# Patient Record
Sex: Male | Born: 1975 | Race: Asian | Hispanic: No | Marital: Married
Health system: Midwestern US, Community
[De-identification: ages and names within clinical notes are randomized; demographics above are authoritative.]

## PROBLEM LIST (undated history)

## (undated) DIAGNOSIS — I25110 Atherosclerotic heart disease of native coronary artery with unstable angina pectoris: Secondary | ICD-10-CM

## (undated) DIAGNOSIS — R7303 Prediabetes: Secondary | ICD-10-CM

## (undated) DIAGNOSIS — J309 Allergic rhinitis, unspecified: Secondary | ICD-10-CM

## (undated) DIAGNOSIS — E782 Mixed hyperlipidemia: Principal | ICD-10-CM

## (undated) DIAGNOSIS — E669 Obesity, unspecified: Secondary | ICD-10-CM

## (undated) DIAGNOSIS — I10 Essential (primary) hypertension: Secondary | ICD-10-CM

## (undated) DIAGNOSIS — L7212 Trichodermal cyst: Secondary | ICD-10-CM

## (undated) DIAGNOSIS — J45909 Unspecified asthma, uncomplicated: Secondary | ICD-10-CM

## (undated) HISTORY — DX: Prediabetes: R73.03

## (undated) HISTORY — DX: Allergic rhinitis, unspecified: J30.9

## (undated) HISTORY — DX: Unspecified asthma, uncomplicated: J45.909

## (undated) HISTORY — DX: Trichodermal cyst: L72.12

## (undated) HISTORY — DX: Mixed hyperlipidemia: E78.2

## (undated) HISTORY — DX: Obesity, unspecified: E66.9

---

## 2005-01-27 ENCOUNTER — Ambulatory Visit (HOSPITAL_BASED_OUTPATIENT_CLINIC_OR_DEPARTMENT_OTHER): Admission: RE | Admit: 2005-01-27 | Discharge: 2005-01-27 | Payer: Self-pay | Admitting: General Surgery

## 2014-04-27 ENCOUNTER — Emergency Department (HOSPITAL_BASED_OUTPATIENT_CLINIC_OR_DEPARTMENT_OTHER): Payer: 59

## 2014-04-27 ENCOUNTER — Encounter (HOSPITAL_BASED_OUTPATIENT_CLINIC_OR_DEPARTMENT_OTHER): Payer: Self-pay

## 2014-04-27 ENCOUNTER — Emergency Department (HOSPITAL_BASED_OUTPATIENT_CLINIC_OR_DEPARTMENT_OTHER)
Admission: EM | Admit: 2014-04-27 | Discharge: 2014-04-28 | Disposition: A | Discharge status: Home / Self Care | Payer: 59 | Attending: Emergency Medicine | Admitting: Emergency Medicine

## 2014-04-27 DIAGNOSIS — R6883 Chills (without fever): Secondary | ICD-10-CM | POA: Insufficient documentation

## 2014-04-27 DIAGNOSIS — R079 Chest pain, unspecified: Secondary | ICD-10-CM | POA: Diagnosis present

## 2014-04-27 DIAGNOSIS — Z79899 Other long term (current) drug therapy: Secondary | ICD-10-CM | POA: Insufficient documentation

## 2014-04-27 DIAGNOSIS — I10 Essential (primary) hypertension: Secondary | ICD-10-CM | POA: Diagnosis not present

## 2014-04-27 HISTORY — DX: Essential (primary) hypertension: I10

## 2014-04-27 LAB — BASIC METABOLIC PANEL WITH GFR
Anion gap: 5 (ref 5–15)
BUN: 14 mg/dL (ref 6–23)
CO2: 28 mmol/L (ref 19–32)
Calcium: 9.2 mg/dL (ref 8.4–10.5)
Chloride: 103 mmol/L (ref 96–112)
Creatinine, Ser: 0.94 mg/dL (ref 0.50–1.35)
GFR calc Af Amer: >90 mL/min (ref >90)
GFR calc non Af Amer: >90 mL/min (ref >90)
Glucose, Bld: 117 mg/dL — ABNORMAL HIGH (ref 70–99)
Potassium: 3.3 mmol/L — ABNORMAL LOW (ref 3.5–5.1)
Sodium: 136 mmol/L (ref 135–145)

## 2014-04-27 LAB — TROPONIN I: Troponin I: <0.03 ng/mL (ref <0.031)

## 2014-04-27 MED ORDER — ASPIRIN 81 MG PO CHEW
324.0000 mg | CHEWABLE_TABLET | Freq: Once | ORAL | Status: AC
  Administered 2014-04-27: 324 mg via ORAL
  Filled 2014-04-27: qty 4

## 2014-04-27 NOTE — ED Notes (Signed)
Pt alert, NAD, calm, interactive, resps e/u, speaking in clear complete sentences, VSS.  

## 2014-04-27 NOTE — ED Notes (Signed)
Pt reports L sided chest wall pain no radiation, denies additional symptoms.  Wife reports that he had one episode of chills, reports earlier today felt like he was going to fall although denies lightheaded.  States "i just felt like my body was going to fall".  Has been seen by pcp for same pain 3 years ago and without diagnosis.

## 2014-04-27 NOTE — ED Provider Notes (Signed)
CSN: [id number]     Arrival date & time 04/27/14  2048 History  This chart was scribed for Enid Skeens, MD by [place] Peace, ED Scribe. The patient was seen in [facility]. The patient's care was started at 10:08 PM.     Chief Complaint  Patient presents with  . Chest Pain      Patient is a 39 y.o. male presenting with chest pain. The history is provided by the patient. No language interpreter was used.  Chest Pain Associated symptoms: no abdominal pain, no back pain, no fever, no headache, no nausea, no shortness of breath and not vomiting     HPI Comments: Godson Pollan is a 39 y.o. male who presents to the Emergency Department complaining of non-radiating left sided chest pain onset 3 days ago, constant since this morning, non radiating, no exertional sxs or diaphoresis. Patient denies blood clot history, active cancer, recent major trauma or surgery, unilateral leg swelling/ pain, recent long travel, hemoptysis or oral contraceptives.  Pt states pain has progressed past the point where he not only feels discomfort, but feels pressure in his chest which is different from any of his past experiences. Pt explains he had similar occurrence 3 years ago where he was seen by his PCP but was not diagnosed. He also reports he has gained 20 lbs recently. Pt is non-smoker.History of hypertension. Pt has family history of heart problems.    Past Medical History  Diagnosis Date  . Hypertension    History reviewed. No pertinent past surgical history. No family history on file. History  Substance Use Topics  . Smoking status: Never Smoker   . Smokeless tobacco: Not on file  . Alcohol Use: No    Review of Systems  Constitutional: Positive for chills. Negative for fever.  HENT: Negative for congestion.   Eyes: Negative for visual disturbance.  Respiratory: Negative for shortness of breath.   Cardiovascular: Positive for chest pain.  Gastrointestinal: Negative for nausea, vomiting and  abdominal pain.  Genitourinary: Negative for dysuria and flank pain.  Musculoskeletal: Negative for back pain, neck pain and neck stiffness.  Skin: Negative for rash.  Neurological: Negative for light-headedness and headaches.      Allergies  Advil  Home Medications   Prior to Admission medications   Medication Sig Start Date End Date Taking? Authorizing Provider  hydrochlorothiazide (MICROZIDE) 12.5 MG capsule Take 12.5 mg by mouth daily.   Yes Historical Provider, MD   BP 143/102 mmHg  Pulse 96  Temp(Src) 98.6 F (37 C) (Oral)  Resp 18  Ht  (1.778 m)  Wt 241 lb (109.317 kg)  BMI 34.58 kg/m2  SpO2 99% Physical Exam  Constitutional: He is oriented to person, place, and time. He appears well-developed and well-nourished. No distress.  HENT:  Head: Normocephalic and atraumatic.  Eyes: Conjunctivae and EOM are normal.  Neck: Neck supple. No tracheal deviation present.  Cardiovascular: Normal rate and regular rhythm.   No leg swelling.   Pulmonary/Chest: Effort normal and breath sounds normal. No respiratory distress.  Abdominal: Soft. There is no tenderness.  Musculoskeletal: Normal range of motion.  Neurological: He is alert and oriented to person, place, and time.  Skin: Skin is warm and dry.  Psychiatric: He has a normal mood and affect. His behavior is normal.  Nursing note and vitals reviewed.   ED Course  Procedures (including critical care time) Labs Review Labs Reviewed  BASIC METABOLIC PANEL - Abnormal; Notable for the following:  Potassium 3.3 (*)    Glucose, Bld 117 (*)    All other components within normal limits  TROPONIN I  TROPONIN I    Imaging Review Dg Chest 2 View  04/27/2014   CLINICAL DATA:  Left-sided chest pain today.  EXAM: CHEST  2 VIEW  COMPARISON:  None.  FINDINGS: The heart size and mediastinal contours are within normal limits. Both lungs are clear. The visualized skeletal structures are unremarkable.  IMPRESSION: Normal chest  x-ray.   Electronically Signed   By: Loralie ChampagneMark  Gallerani M.D.   On: 04/27/2014 22:36     EKG Interpretation None     Medications  aspirin chewable tablet 324 mg (324 mg Oral Given 04/27/14 2223)   EKG reviewed heart rate 101, nonspecific T wave inversions inferiorly and flattening laterally, no acute ST elevation, poor baseline, mild prolonged QT. EKG reviewed heart rate 91, T-wave inversions inferiorly, no acute ST elevation, mild prolonged QT 10:15 PM- Treatment plan was discussed with patient who verbalizes understanding and agrees.   MDM   Final diagnoses:  Chest pain, unspecified chest pain type  Chest pain  I personally performed the services described in this documentation, which was scribed in my presence. The recorded information has been reviewed and is accurate.  Patient with high blood pressure history, low risk cardiac, very low risk blood clot presents with constant chest pain since this morning. EKG T-wave inversions inferiorly no acute ST elevation, similar to one on arrival had a poor baseline.  Plan for delta troponin, patient has had persistent left-sided chest ache since early this morning. Discussed outpatient follow-up for stress test and further evaluation. Patient comfortable this plan.  Patient's care be signed out to follow-up second troponin, if negative plan for discharge.  Results and differential diagnosis were discussed with the patient/parent/guardian. Close follow up outpatient was discussed, comfortable with the plan.   Medications  aspirin chewable tablet 324 mg (324 mg Oral Given 04/27/14 2223)    Filed Vitals:   04/27/14 2100 04/27/14 2230 04/27/14 2245 04/27/14 2300  BP: 143/102 123/85 114/76 109/64  Pulse: 96 92 92 88  Temp: 98.6 F (37 C)     TempSrc: Oral     Resp: 18     Height: 5\' 10"  (1.778 m)     Weight: 241 lb (109.317 kg)     SpO2: 99% 98% 98% 96%    Final diagnoses:  Chest pain, unspecified chest pain type       Enid SkeensJoshua M  Kohei Antonellis, MD 04/27/14 2350

## 2014-04-27 NOTE — Discharge Instructions (Signed)
Discuss further work up with your doctor, discuss possible stress test.  If you were given medicines take as directed.  If you are on coumadin or contraceptives realize their levels and effectiveness is altered by many different medicines.  If you have any reaction (rash, tongues swelling, other) to the medicines stop taking and see a physician.   Please follow up as directed and return to the ER or see a physician for new or worsening symptoms.  Thank you. Filed Vitals:   04/27/14 2100 04/27/14 2230 04/27/14 2245 04/27/14 2300  BP: 143/102 123/85 114/76 109/64  Pulse: 96 92 92 88  Temp: 98.6 F (37 C)     TempSrc: Oral     Resp: 18     Height: 5\' 10"  (1.778 m)     Weight: 241 lb (109.317 kg)     SpO2: 99% 98% 98% 96%

## 2014-04-27 NOTE — ED Notes (Signed)
Dr. Zavitz at BS.  

## 2014-04-28 LAB — TROPONIN I: Troponin I: <0.03 ng/mL (ref <0.031)

## 2014-04-28 NOTE — ED Notes (Signed)
Alert, NAD, calm, interactive, no changes, "feel a little better".

## 2014-11-12 ENCOUNTER — Telehealth (HOSPITAL_COMMUNITY): Payer: Self-pay

## 2014-11-12 ENCOUNTER — Other Ambulatory Visit (HOSPITAL_COMMUNITY): Payer: Self-pay | Admitting: Internal Medicine

## 2014-11-12 DIAGNOSIS — R079 Chest pain, unspecified: Secondary | ICD-10-CM

## 2014-11-12 NOTE — Telephone Encounter (Signed)
Patient given detailed instructions per Myocardial Perfusion Study Information Sheet for test on 11-13-2014 at 0730. Patient Notified to arrive 15 minutes early, and that it is imperative to arrive on time for appointment to keep from having the test rescheduled. Patient verbalized understanding. Randa Evens, Rashi Granier A

## 2014-11-13 ENCOUNTER — Ambulatory Visit (HOSPITAL_COMMUNITY): Payer: 59 | Attending: Cardiology

## 2014-11-13 DIAGNOSIS — R0609 Other forms of dyspnea: Secondary | ICD-10-CM | POA: Diagnosis not present

## 2014-11-13 DIAGNOSIS — R079 Chest pain, unspecified: Secondary | ICD-10-CM | POA: Diagnosis not present

## 2014-11-13 MED ORDER — TECHNETIUM TC 99M SESTAMIBI GENERIC - CARDIOLITE
10.3000 | Freq: Once | INTRAVENOUS | Status: AC | PRN
Start: 2014-11-13 — End: 2014-11-13
  Administered 2014-11-13: 10 via INTRAVENOUS

## 2014-11-13 MED ORDER — TECHNETIUM TC 99M SESTAMIBI GENERIC - CARDIOLITE
30.5000 | Freq: Once | INTRAVENOUS | Status: AC | PRN
  Administered 2014-11-13: 31 via INTRAVENOUS

## 2014-11-14 LAB — MYOCARDIAL PERFUSION IMAGING
CHL CUP NUCLEAR SSS: 0
CHL RATE OF PERCEIVED EXERTION: 18
CSEPEW: 10.1 METS
CSEPHR: 94 %
Exercise duration (min): 9 min
Exercise duration (sec): 0 s
LV dias vol: 73 mL
LV sys vol: 23 mL
MPHR: 183 {beats}/min
Peak HR: 173 {beats}/min
RATE: 0.3
Rest HR: 87 {beats}/min
SDS: 0
SRS: 0
TID: 0.64

## 2014-12-31 ENCOUNTER — Ambulatory Visit
Admission: RE | Admit: 2014-12-31 | Discharge: 2014-12-31 | Disposition: A | Discharge status: Home / Self Care | Payer: 59 | Source: Ambulatory Visit | Attending: Nurse Practitioner | Admitting: Nurse Practitioner

## 2014-12-31 ENCOUNTER — Other Ambulatory Visit: Payer: Self-pay | Admitting: Nurse Practitioner

## 2014-12-31 DIAGNOSIS — M25562 Pain in left knee: Secondary | ICD-10-CM

## 2015-07-14 ENCOUNTER — Other Ambulatory Visit: Payer: Self-pay | Admitting: Internal Medicine

## 2015-07-14 DIAGNOSIS — Z0000 Encounter for general adult medical examination without abnormal findings: Secondary | ICD-10-CM | POA: Diagnosis not present

## 2015-07-14 DIAGNOSIS — Z125 Encounter for screening for malignant neoplasm of prostate: Secondary | ICD-10-CM | POA: Diagnosis not present

## 2015-07-14 DIAGNOSIS — Z1389 Encounter for screening for other disorder: Secondary | ICD-10-CM | POA: Diagnosis not present

## 2015-07-14 DIAGNOSIS — M25562 Pain in left knee: Secondary | ICD-10-CM

## 2015-07-19 ENCOUNTER — Ambulatory Visit
Admission: RE | Admit: 2015-07-19 | Discharge: 2015-07-19 | Disposition: A | Discharge status: Home / Self Care | Payer: Self-pay | Source: Ambulatory Visit | Attending: Internal Medicine | Admitting: Internal Medicine

## 2015-07-19 DIAGNOSIS — M25562 Pain in left knee: Secondary | ICD-10-CM

## 2015-08-05 DIAGNOSIS — B079 Viral wart, unspecified: Secondary | ICD-10-CM | POA: Diagnosis not present

## 2016-02-26 DIAGNOSIS — G4710 Hypersomnia, unspecified: Secondary | ICD-10-CM | POA: Diagnosis not present

## 2016-02-26 DIAGNOSIS — Z0001 Encounter for general adult medical examination with abnormal findings: Secondary | ICD-10-CM | POA: Diagnosis not present

## 2016-02-26 DIAGNOSIS — R030 Elevated blood-pressure reading, without diagnosis of hypertension: Secondary | ICD-10-CM | POA: Diagnosis not present

## 2016-02-26 DIAGNOSIS — R0602 Shortness of breath: Secondary | ICD-10-CM | POA: Diagnosis not present

## 2016-02-26 DIAGNOSIS — R0683 Snoring: Secondary | ICD-10-CM | POA: Diagnosis not present

## 2016-03-02 DIAGNOSIS — R0602 Shortness of breath: Secondary | ICD-10-CM | POA: Diagnosis not present

## 2016-03-02 DIAGNOSIS — R0683 Snoring: Secondary | ICD-10-CM | POA: Diagnosis not present

## 2016-03-02 DIAGNOSIS — G4710 Hypersomnia, unspecified: Secondary | ICD-10-CM | POA: Diagnosis not present

## 2016-03-02 DIAGNOSIS — R030 Elevated blood-pressure reading, without diagnosis of hypertension: Secondary | ICD-10-CM | POA: Diagnosis not present

## 2016-07-14 DIAGNOSIS — Z0000 Encounter for general adult medical examination without abnormal findings: Secondary | ICD-10-CM | POA: Diagnosis not present

## 2016-07-14 DIAGNOSIS — R7303 Prediabetes: Secondary | ICD-10-CM | POA: Diagnosis not present

## 2016-07-14 DIAGNOSIS — Z125 Encounter for screening for malignant neoplasm of prostate: Secondary | ICD-10-CM | POA: Diagnosis not present

## 2016-07-14 DIAGNOSIS — Z1389 Encounter for screening for other disorder: Secondary | ICD-10-CM | POA: Diagnosis not present

## 2017-02-02 ENCOUNTER — Other Ambulatory Visit: Payer: Self-pay | Admitting: Internal Medicine

## 2017-02-02 DIAGNOSIS — R0789 Other chest pain: Secondary | ICD-10-CM

## 2017-02-08 ENCOUNTER — Ambulatory Visit
Admission: RE | Admit: 2017-02-08 | Discharge: 2017-02-08 | Disposition: A | Discharge status: Home / Self Care | Payer: No Typology Code available for payment source | Source: Ambulatory Visit | Attending: Internal Medicine | Admitting: Internal Medicine

## 2017-02-08 DIAGNOSIS — R0789 Other chest pain: Secondary | ICD-10-CM

## 2017-02-09 ENCOUNTER — Other Ambulatory Visit: Payer: Self-pay

## 2017-02-09 DIAGNOSIS — J45909 Unspecified asthma, uncomplicated: Secondary | ICD-10-CM | POA: Insufficient documentation

## 2017-02-09 DIAGNOSIS — L7212 Trichodermal cyst: Secondary | ICD-10-CM

## 2017-02-09 DIAGNOSIS — J309 Allergic rhinitis, unspecified: Secondary | ICD-10-CM

## 2017-02-09 DIAGNOSIS — R7303 Prediabetes: Secondary | ICD-10-CM

## 2017-02-09 DIAGNOSIS — I10 Essential (primary) hypertension: Secondary | ICD-10-CM | POA: Insufficient documentation

## 2017-02-09 DIAGNOSIS — E669 Obesity, unspecified: Secondary | ICD-10-CM

## 2017-02-09 DIAGNOSIS — E782 Mixed hyperlipidemia: Secondary | ICD-10-CM

## 2017-02-09 HISTORY — DX: Mixed hyperlipidemia: E78.2

## 2017-02-09 HISTORY — DX: Allergic rhinitis, unspecified: J30.9

## 2017-02-09 HISTORY — DX: Prediabetes: R73.03

## 2017-02-09 HISTORY — DX: Trichodermal cyst: L72.12

## 2017-02-09 HISTORY — DX: Obesity, unspecified: E66.9

## 2017-02-20 NOTE — Progress Notes (Signed)
Cardiology Office Note   Date:  02/22/2017   ID:  Gregory ElseMuhammad Hancock, DOB 1975-05-29, MRN [id number]  PCP:  Georgann HousekeeperHusain, Karrar, MD    No chief complaint on file. chest pain   Wt Readings from Last 3 Encounters:  02/22/17 254 lb 3.2 oz (115.3 kg)  11/13/14 240 lb (108.9 kg)  04/27/14 241 lb (109.3 kg)       History of Present Illness: Gregory ElseMuhammad Hage is a 41 y.o. male who is being seen today for the evaluation of chest pain at the request of Georgann HousekeeperHusain, Karrar, MD.  He has had recurrent chest pain and had coronary CT showing coronary calcium in the LA distribution, in 11/18.  It is more of a "crawling feeling" on his chest.  It is not a pain.  He had a friend die suddently and this has been a source of anxiety.  Trigger for this is eating heavy meals.  He was given acid reducer but did not take.    He has recently started exercising by walking.  No chest crawling.  Drinking water helps.  Family h/o CAD.  Maternal uncle died at 2058, Paternal uncle died at 6160 from heart attack.    His stress has reduced as time has passed since his friend suddenly passed away.  He is feeling this crawling sensation lasts now. He has started walking and often times, feels like walking more after he finishes his planned routine. He has never had to stop walking due to any symptoms. He is making a commitment to improving his lifestyle. He is trying to eat healthier. He has also trying to get his family to be healthier as well. He mentions that his kids are somewhat overweight and he is trying to help reverse this trend. He is also committed to reducing stress in his professional life as well. 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    Past Medical History:  Diagnosis Date  . Allergic rhinitis 02/09/2017  . Elevated triglycerides with high cholesterol 02/09/2017  . Hypertension   . Obesity (BMI 30-39.9) 02/09/2017  . Prediabetes 02/09/2017  . RAD (reactive  airway disease)   . 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 No pertinent surgical history.   Current Outpatient Medications  Medication Sig Dispense Refill  . chlorthalidone (HYGROTON) 25 MG tablet Take 12.5 mg by mouth daily with breakfast.    . montelukast (SINGULAIR) 10 MG tablet Take 10 mg by mouth daily.    . ranitidine (ZANTAC) 150 MG tablet Take 150 mg by mouth 2 (two) times daily. 1 TABLET  AT BEDTIME    . atorvastatin (LIPITOR) 10 MG tablet Take 1 tablet (10 mg total) by mouth daily. 90 tablet 3   No current facility-administered medications for this visit.     Allergies:   Advil [ibuprofen]    Social History:  The patient  reports that  has never smoked. he has never used smokeless tobacco. He reports that he does not drink alcohol or use drugs.   Family History:  The patient's family history includes Heart attack in his maternal uncle and paternal aunt.    ROS:  Please see the history of present illness.   Otherwise, review of systems are positive for chest sensation as described above.   All other systems are reviewed and negative.    PHYSICAL EXAM: VS:  BP 140/80 (BP Location: Right Arm, Patient Position: Sitting, Cuff Size: Large)  Pulse 98   Ht 5\' 10"  (1.778 m)   Wt 254 lb 3.2 oz (115.3 kg)   SpO2 97%   BMI 36.47 kg/m  , BMI Body mass index is 36.47 kg/m. GEN: Well nourished, well developed, in no acute distress  HEENT: normal  Neck: no JVD, carotid bruits, or masses Cardiac: RRR; no murmurs, rubs, or gallops,no edema  Respiratory:  clear to auscultation bilaterally, normal work of breathing GI: soft, nontender, nondistended, + BS MS: no deformity or atrophy  Skin: warm and dry, no rash Neuro:  Strength and sensation are intact Psych: euthymic mood, full affect   EKG:   The ekg ordered today demonstrates NSR, early repol type- ST changes in the anterior leads   Recent Labs: No results found for requested labs within last 8760 hours.    Lipid Panel No results found for: CHOL, TRIG, HDL, CHOLHDL, VLDL, LDLCALC, LDLDIRECT   Other studies Reviewed: Additional studies/ records that were reviewed today with results demonstrating: CT scan reviewed showing coronary calcium.   ASSESSMENT AND PLAN:  1. Chest discomfort: Very atypical.  Its nonexertional and getting better.  Would not pursue cath.   2. Coronary calcium: Given his lack of typical symptoms, and lack of difficulty with exercise, would not pursue any invasive angiography. Instead, I encouraged him to continue with his efforts on risk factor modification. Will add atorvastatin 10 mg daily. Check cholesterol and liver tests in 2-3 months. Continue regular exercise. If his symptoms change, let us know. If he has any limitations on his exercise, he should let us know. He is in agreement with this plan.  Keep LDL < 100.  3. Hypertension: Borderline blood pressure now. With weight loss and increased exercise, these readings should improve. 4. Obesity: Working on weight loss.    Current medicines are reviewed at length with the patient today.  The patient concerns regarding his medicines were addressed.  The following changes have been made:  No change  Labs/ tests ordered today include:   Orders Placed This Encounter  Procedures  . Lipid panel  . Hepatic function panel  . EKG 12-Lead    Recommend 150 minutes/week of aerobic exercise Low fat, low carb, high fiber diet recommended  Disposition:   FU in 1 year   Signed, Lance MussJayadeep Kinsler Soeder, MD  02/22/2017 3:33 PM    [facility] [address], [place], [place]  [address] Phone: [phone]; Fax: [phone]

## 2017-02-22 ENCOUNTER — Encounter: Payer: Self-pay | Admitting: Interventional Cardiology

## 2017-02-22 ENCOUNTER — Ambulatory Visit (INDEPENDENT_AMBULATORY_CARE_PROVIDER_SITE_OTHER): Payer: BLUE CROSS/BLUE SHIELD | Admitting: Interventional Cardiology

## 2017-02-22 VITALS — BP 140/80 | HR 98 | Ht 70.0 in | Wt 254.2 lb

## 2017-02-22 DIAGNOSIS — I2510 Atherosclerotic heart disease of native coronary artery without angina pectoris: Secondary | ICD-10-CM | POA: Diagnosis not present

## 2017-02-22 DIAGNOSIS — E669 Obesity, unspecified: Secondary | ICD-10-CM | POA: Diagnosis not present

## 2017-02-22 DIAGNOSIS — E785 Hyperlipidemia, unspecified: Secondary | ICD-10-CM | POA: Diagnosis not present

## 2017-02-22 DIAGNOSIS — R079 Chest pain, unspecified: Secondary | ICD-10-CM

## 2017-02-22 MED ORDER — ATORVASTATIN CALCIUM 10 MG PO TABS: 10.0000 mg | ORAL_TABLET | Freq: Every day | ORAL | 3 refills | Status: DC

## 2017-02-22 NOTE — Patient Instructions (Addendum)
Medication Instructions:  Your physician has recommended you make the following change in your medication:   START: atorvastatin (lipitor) 10 mg (1 tablet) daily  Labwork: Your physician recommends that you return for FASTING lab work in 2-3 months: cholesterol and liver function   Testing/Procedures: None ordered  Follow-Up: Your physician wants you to follow-up in: 1 year with Dr. Eldridge DaceVaranasi. You will receive a reminder letter in the mail two months in advance. If you don't receive a letter, please call our office to schedule the follow-up appointment.   Any Other Special Instructions Will Be Listed Below (If Applicable).     If you need a refill on your cardiac medications before your next appointment, please call your pharmacy.
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
## 2017-05-17 ENCOUNTER — Other Ambulatory Visit: Payer: BLUE CROSS/BLUE SHIELD

## 2017-08-08 DIAGNOSIS — J309 Allergic rhinitis, unspecified: Secondary | ICD-10-CM | POA: Diagnosis not present

## 2017-08-08 DIAGNOSIS — J9801 Acute bronchospasm: Secondary | ICD-10-CM | POA: Diagnosis not present

## 2017-09-14 DIAGNOSIS — Z1389 Encounter for screening for other disorder: Secondary | ICD-10-CM | POA: Diagnosis not present

## 2017-09-14 DIAGNOSIS — Z0000 Encounter for general adult medical examination without abnormal findings: Secondary | ICD-10-CM | POA: Diagnosis not present

## 2017-09-14 DIAGNOSIS — R7303 Prediabetes: Secondary | ICD-10-CM | POA: Diagnosis not present

## 2017-10-26 DIAGNOSIS — Z713 Dietary counseling and surveillance: Secondary | ICD-10-CM | POA: Diagnosis not present

## 2017-12-12 DIAGNOSIS — E1169 Type 2 diabetes mellitus with other specified complication: Secondary | ICD-10-CM | POA: Diagnosis not present

## 2017-12-12 DIAGNOSIS — E119 Type 2 diabetes mellitus without complications: Secondary | ICD-10-CM | POA: Diagnosis not present

## 2017-12-12 DIAGNOSIS — I10 Essential (primary) hypertension: Secondary | ICD-10-CM | POA: Diagnosis not present

## 2017-12-12 DIAGNOSIS — Z23 Encounter for immunization: Secondary | ICD-10-CM | POA: Diagnosis not present

## 2018-02-14 ENCOUNTER — Other Ambulatory Visit: Payer: Self-pay | Admitting: Interventional Cardiology

## 2018-04-05 DIAGNOSIS — I10 Essential (primary) hypertension: Secondary | ICD-10-CM | POA: Diagnosis not present

## 2018-04-05 DIAGNOSIS — E782 Mixed hyperlipidemia: Secondary | ICD-10-CM | POA: Diagnosis not present

## 2018-04-05 DIAGNOSIS — J309 Allergic rhinitis, unspecified: Secondary | ICD-10-CM | POA: Diagnosis not present

## 2018-04-05 DIAGNOSIS — E1169 Type 2 diabetes mellitus with other specified complication: Secondary | ICD-10-CM | POA: Diagnosis not present

## 2018-04-08 ENCOUNTER — Other Ambulatory Visit: Payer: Self-pay | Admitting: Interventional Cardiology

## 2018-04-19 DIAGNOSIS — E119 Type 2 diabetes mellitus without complications: Secondary | ICD-10-CM | POA: Diagnosis not present

## 2018-04-19 DIAGNOSIS — H35712 Central serous chorioretinopathy, left eye: Secondary | ICD-10-CM | POA: Diagnosis not present

## 2018-04-19 DIAGNOSIS — H2513 Age-related nuclear cataract, bilateral: Secondary | ICD-10-CM | POA: Diagnosis not present

## 2018-04-20 DIAGNOSIS — H35723 Serous detachment of retinal pigment epithelium, bilateral: Secondary | ICD-10-CM | POA: Diagnosis not present

## 2018-04-20 DIAGNOSIS — H5319 Other subjective visual disturbances: Secondary | ICD-10-CM | POA: Diagnosis not present

## 2018-04-20 DIAGNOSIS — H35712 Central serous chorioretinopathy, left eye: Secondary | ICD-10-CM | POA: Diagnosis not present

## 2018-07-04 DIAGNOSIS — I10 Essential (primary) hypertension: Secondary | ICD-10-CM | POA: Diagnosis not present

## 2018-08-07 DIAGNOSIS — E782 Mixed hyperlipidemia: Secondary | ICD-10-CM | POA: Diagnosis not present

## 2018-08-07 DIAGNOSIS — I10 Essential (primary) hypertension: Secondary | ICD-10-CM | POA: Diagnosis not present

## 2018-08-07 DIAGNOSIS — E1169 Type 2 diabetes mellitus with other specified complication: Secondary | ICD-10-CM | POA: Diagnosis not present

## 2018-12-13 DIAGNOSIS — I10 Essential (primary) hypertension: Secondary | ICD-10-CM | POA: Diagnosis not present

## 2018-12-13 DIAGNOSIS — Z23 Encounter for immunization: Secondary | ICD-10-CM | POA: Diagnosis not present

## 2018-12-13 DIAGNOSIS — Z0000 Encounter for general adult medical examination without abnormal findings: Secondary | ICD-10-CM | POA: Diagnosis not present

## 2018-12-13 DIAGNOSIS — E1169 Type 2 diabetes mellitus with other specified complication: Secondary | ICD-10-CM | POA: Diagnosis not present

## 2018-12-13 DIAGNOSIS — E119 Type 2 diabetes mellitus without complications: Secondary | ICD-10-CM | POA: Diagnosis not present

## 2018-12-13 DIAGNOSIS — Z1389 Encounter for screening for other disorder: Secondary | ICD-10-CM | POA: Diagnosis not present

## 2018-12-13 DIAGNOSIS — Z125 Encounter for screening for malignant neoplasm of prostate: Secondary | ICD-10-CM | POA: Diagnosis not present

## 2019-01-02 DIAGNOSIS — I10 Essential (primary) hypertension: Secondary | ICD-10-CM | POA: Diagnosis not present

## 2019-01-02 DIAGNOSIS — R0602 Shortness of breath: Secondary | ICD-10-CM | POA: Diagnosis not present

## 2019-01-02 DIAGNOSIS — R0789 Other chest pain: Secondary | ICD-10-CM | POA: Diagnosis not present

## 2019-01-02 DIAGNOSIS — E785 Hyperlipidemia, unspecified: Secondary | ICD-10-CM | POA: Diagnosis not present

## 2019-01-24 DIAGNOSIS — I519 Heart disease, unspecified: Secondary | ICD-10-CM | POA: Diagnosis not present

## 2019-01-24 DIAGNOSIS — I517 Cardiomegaly: Secondary | ICD-10-CM | POA: Diagnosis not present

## 2019-01-24 DIAGNOSIS — I059 Rheumatic mitral valve disease, unspecified: Secondary | ICD-10-CM | POA: Diagnosis not present

## 2019-01-31 DIAGNOSIS — R0789 Other chest pain: Secondary | ICD-10-CM | POA: Diagnosis not present

## 2019-01-31 DIAGNOSIS — R0602 Shortness of breath: Secondary | ICD-10-CM | POA: Diagnosis not present

## 2019-04-08 DIAGNOSIS — Z9189 Other specified personal risk factors, not elsewhere classified: Secondary | ICD-10-CM | POA: Diagnosis not present

## 2019-04-08 DIAGNOSIS — U071 COVID-19: Secondary | ICD-10-CM | POA: Diagnosis not present

## 2019-04-08 DIAGNOSIS — Z20822 Contact with and (suspected) exposure to covid-19: Secondary | ICD-10-CM | POA: Diagnosis not present

## 2019-04-09 ENCOUNTER — Telehealth: Payer: Self-pay | Admitting: Physician Assistant

## 2019-04-09 NOTE — Telephone Encounter (Signed)
Called to discuss with Coral Else about Covid symptoms and the use of bamlanivimab, a monoclonal antibody infusion for those with mild to moderate Covid symptoms and at a high risk of hospitalization.    Pt is qualified for this infusion at the [facility] infusion center due to co-morbid conditions and/or a member of an at-risk group, however declines infusion at this time. He would like to think about it and call me back. He wanted to see if his PCR test comes back positive since he is having such mild symptoms today. Last date he would be eligible for infusion is 04/16/19.   Patient Active Problem List   Diagnosis Date Noted  . Elevated triglycerides with high cholesterol 02/09/2017  . 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 Prediabetes 02/09/2017  . Obesity (BMI 30-39.9) 02/09/2017  . Allergic rhinitis 02/09/2017  . Hypertension   . RAD (reactive airway disease)     Cline Crock PA-C  [facility]

## 2019-04-12 DIAGNOSIS — U071 COVID-19: Secondary | ICD-10-CM | POA: Diagnosis not present

## 2019-04-12 DIAGNOSIS — Z9189 Other specified personal risk factors, not elsewhere classified: Secondary | ICD-10-CM | POA: Diagnosis not present

## 2019-04-18 DIAGNOSIS — J101 Influenza due to other identified influenza virus with other respiratory manifestations: Secondary | ICD-10-CM | POA: Diagnosis not present

## 2019-04-18 DIAGNOSIS — Z20828 Contact with and (suspected) exposure to other viral communicable diseases: Secondary | ICD-10-CM | POA: Diagnosis not present

## 2019-04-18 DIAGNOSIS — U071 COVID-19: Secondary | ICD-10-CM | POA: Diagnosis not present

## 2019-04-18 DIAGNOSIS — B974 Respiratory syncytial virus as the cause of diseases classified elsewhere: Secondary | ICD-10-CM | POA: Diagnosis not present

## 2019-04-20 DIAGNOSIS — Z9189 Other specified personal risk factors, not elsewhere classified: Secondary | ICD-10-CM | POA: Diagnosis not present

## 2019-04-20 DIAGNOSIS — U071 COVID-19: Secondary | ICD-10-CM | POA: Diagnosis not present

## 2019-12-10 DIAGNOSIS — Z125 Encounter for screening for malignant neoplasm of prostate: Secondary | ICD-10-CM | POA: Diagnosis not present

## 2019-12-10 DIAGNOSIS — Z23 Encounter for immunization: Secondary | ICD-10-CM | POA: Diagnosis not present

## 2019-12-10 DIAGNOSIS — E782 Mixed hyperlipidemia: Secondary | ICD-10-CM | POA: Diagnosis not present

## 2019-12-10 DIAGNOSIS — I10 Essential (primary) hypertension: Secondary | ICD-10-CM | POA: Diagnosis not present

## 2019-12-10 DIAGNOSIS — Z0000 Encounter for general adult medical examination without abnormal findings: Secondary | ICD-10-CM | POA: Diagnosis not present

## 2019-12-10 DIAGNOSIS — E1169 Type 2 diabetes mellitus with other specified complication: Secondary | ICD-10-CM | POA: Diagnosis not present

## 2020-02-21 DIAGNOSIS — R0602 Shortness of breath: Secondary | ICD-10-CM | POA: Diagnosis not present

## 2020-02-21 DIAGNOSIS — R079 Chest pain, unspecified: Secondary | ICD-10-CM | POA: Diagnosis not present

## 2020-02-21 DIAGNOSIS — E785 Hyperlipidemia, unspecified: Secondary | ICD-10-CM | POA: Diagnosis not present

## 2020-02-21 DIAGNOSIS — I10 Essential (primary) hypertension: Secondary | ICD-10-CM | POA: Diagnosis not present

## 2020-02-26 ENCOUNTER — Telehealth: Payer: Self-pay

## 2020-02-26 NOTE — Telephone Encounter (Signed)
Called patient to verify his information and send over to [facility] for sleep study per [facility]sk. Gregory Hancock

## 2020-02-28 ENCOUNTER — Other Ambulatory Visit (INDEPENDENT_AMBULATORY_CARE_PROVIDER_SITE_OTHER): Payer: BLUE CROSS/BLUE SHIELD | Admitting: Internal Medicine

## 2020-02-28 DIAGNOSIS — I10 Essential (primary) hypertension: Secondary | ICD-10-CM

## 2020-02-28 DIAGNOSIS — J4520 Mild intermittent asthma, uncomplicated: Secondary | ICD-10-CM

## 2020-02-28 DIAGNOSIS — I2510 Atherosclerotic heart disease of native coronary artery without angina pectoris: Secondary | ICD-10-CM | POA: Diagnosis not present

## 2020-02-28 DIAGNOSIS — G4719 Other hypersomnia: Secondary | ICD-10-CM | POA: Diagnosis not present

## 2020-02-28 DIAGNOSIS — I2584 Coronary atherosclerosis due to calcified coronary lesion: Secondary | ICD-10-CM

## 2020-02-28 DIAGNOSIS — E669 Obesity, unspecified: Secondary | ICD-10-CM

## 2020-02-28 NOTE — Progress Notes (Signed)
[facility] [address] [place], [place] [address]  Pulmonary Sleep Medicine   Office Visit Note  Patient Name: Gregory Hancock DOB: [birth year] MRN [id number]  Date of Service: 02/28/2020  Complaints/HPI: Patient is being seen for possible obstructive sleep apnea.  Patient has a past medical history significant for diabetes mellitus hypertension obesity weighing in at 254 pounds who has noticed that he is having increased daytime somnolence.  Patient has heavy snoring noted his wife tells him that he snores all night long.  Is also been experiencing on and off chest pain.  He has seen cardiology also for the chest pain patient had described it as pressure and also he had associated shortness of breath noted.  Pain at apparently last between 5 and 10 minutes.  He notices perhaps some orthopnea associated but not all the time.  Patient does have a strong history of coronary artery disease in the family.  He has been seen by cardiology in the past and has had a negative nuclear stress test and also a normal ejection fraction.  His symptoms however have persisted and patient is scheduled for coronary angiogram.  In addition to the cardiac issues that are going on the patient has been having excessive daytime somnolence.  He feels very sleepy during the daytime.  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  He has had no episodes of falling asleep while driving and has no history of seizure disorder.  Patient also has a history of chronic obstructive asthma.  He does use inhalers for this purpose.  He has never had any episodes of status asthmaticus.  And he has never smoked.  Current medications include Lipitor Singulair ranitidine and inhaler as needed  ROS  General: (-) fever, (-) chills, (-) night sweats, (-) weakness Skin: (-) rashes, (-) itching,. Eyes: (-) visual changes, (-) redness, (-) itching. Nose and Sinuses: (-)  nasal stuffiness or itchiness, (-) postnasal drip, (-) nosebleeds, (-) sinus trouble. Mouth and Throat: (-) sore throat, (-) hoarseness. Neck: (-) swollen glands, (-) enlarged thyroid, (-) neck pain. Respiratory: - cough, (-) bloody sputum, + shortness of breath, + wheezing. Cardiovascular: - ankle swelling, (-) chest pain. Lymphatic: (-) lymph node enlargement. Neurologic: (-) numbness, (-) tingling. Psychiatric: (-) anxiety, (-) depression   Current Medication: Outpatient Encounter Medications as of 02/28/2020  Medication Sig  . atorvastatin (LIPITOR) 10 MG tablet TAKE 1 TABLET BY MOUTH ONCE DAILY  . chlorthalidone (HYGROTON) 25 MG tablet Take 12.5 mg by mouth daily with breakfast.  . montelukast (SINGULAIR) 10 MG tablet Take 10 mg by mouth daily.  . ranitidine (ZANTAC) 150 MG tablet Take 150 mg by mouth 2 (two) times daily. 1 TABLET  AT BEDTIME   No facility-administered encounter medications on file as of 02/28/2020.    Surgical History: No past surgical history on file.  Medical History: Past Medical History:  Diagnosis Date  . Allergic rhinitis 02/09/2017  . Elevated triglycerides with high cholesterol 02/09/2017  . Hypertension   . Obesity (BMI 30-39.9) 02/09/2017  . Prediabetes 02/09/2017  . RAD (reactive airway disease)   . Trichilemmal cyst 02/09/2017    Family History: Family History  Problem Relation Age of Onset  . Heart attack Maternal Uncle   . Heart attack Paternal Aunt     Social History: Social History   Socioeconomic History  . Marital status: Married    Spouse name: Not on file  .  Number of children: Not on file  . Years of education: Not on file  . Highest education level: Not on file  Occupational History  . Not on file  Tobacco Use  . Smoking status: Never Smoker  . Smokeless tobacco: Never Used  Substance and Sexual Activity  . Alcohol use: No  . Drug use: No  . Sexual activity: Not on file  Other Topics Concern  . Not on file   Social History Narrative  . Not on file   Social Determinants of Health   Financial Resource Strain: Not on file  Food Insecurity: Not on file  Transportation Needs: Not on file  Physical Activity: Not on file  Stress: Not on file  Social Connections: Not on file  Intimate Partner Violence: Not on file    Vital Signs: There were no vitals taken for this visit.  Examination: General Appearance: The patient is well-developed, well-nourished, and in no distress. Skin: Gross inspection of skin unremarkable. Head: normocephalic, no gross deformities. Eyes: no gross deformities noted. ENT: ears appear grossly normal no exudates. Neck: Supple. No thyromegaly. No LAD. Respiratory: Scattered rhonchi expansion is equal. Cardiovascular: Normal S1 and S2 without murmur or rub. Extremities: No cyanosis. pulses are equal. Neurologic: Alert and oriented. No involuntary movements.  LABS: No results found for this or any previous visit (from the past 2160 hour(s)).  Radiology: CT CARDIAC SCORING  Result Date: 02/08/2017 CLINICAL DATA:  44 year old with family history of coronary artery disease. One month history of left chest pain and discomfort. Current history of hypertension. EXAM: CT HEART FOR CALCIUM SCORING TECHNIQUE: CT heart was performed on a 64 channel system using prospective ECG gating. A non-contrast exam for calcium scoring was performed. Note that this exam targets the heart and the chest was not imaged in its entirety. COMPARISON:  None. FINDINGS: Technical quality: Good. Respiratory motion blurred images of the base of the heart and the lung bases, though the calcium score does not appear to have been affected by this motion. CORONARY CALCIUM Total Agatston Score: 22.9 (all of the calcium is present in the LAD and its branches) MESA database percentile: Not applicable. The MESA database is relevant only between ages 66 and 92. OTHER FINDINGS: Cardiovascular: No evidence of  atherosclerosis involving the visualized thoracic aorta. Thoracic aorta normal in caliber with maximum diameter of the ascending thoracic aorta measuring approximately 3.4 cm. No pericardial effusion. Mediastinum/Nodes: No pathologic lymphadenopathy within the visualized mediastinum. Visualized esophagus normal in appearance. Lungs/Pleura: Visualized lung parenchyma clear. Central bronchi patent without significant bronchial wall thickening. No pleural effusions. Upper Abdomen: Normal low-dose unenhanced appearance. Large amount of food within the stomach. Musculoskeletal: Regional skeleton unremarkable. Other:  Minimal bilateral gynecomastia. IMPRESSION: 1. Coronary calcium Agatston score of 22.9, with the coronary atherosclerosis present in the LAD and its branches. This is greater atherosclerosis than expected at this patient's age. 2. The MESA database is not applicable for this patient (relevant only between ages 62 and 22) and therefore a percentile is not given. 3. No significant extracardiac findings. Minimal bilateral gynecomastia is noted. Electronically Signed   By: Hulan Saas M.D.   On: 02/08/2017 16:29    No results found.  No results found.    Assessment and Plan: Patient Active Problem List   Diagnosis Date Noted  . Elevated triglycerides with high cholesterol 02/09/2017  . 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 Prediabetes 02/09/2017  . Obesity (BMI 30-39.9) 02/09/2017  . Allergic rhinitis 02/09/2017  . Hypertension   .  RAD (reactive airway disease)     1. Hypersomnia with probable obstructive sleep apnea.  Patient has excessive daytime somnolence along with his sleepiness he also has a history of hypertension has a additional risk factor patient had an elevated coronary score with coronary atherosclerosis in the LAD as well as its branches.  Patient is also undergoing a work-up for the coronary artery disease with cardiology. 2. Coronary artery disease patient has known coronary  atherosclerosis in the LAD as well as the branches.  He has been having increasing chest pain noted and he is also with a strong family history of cardiac disease.  Last echocardiogram was done in 2028 last stress test was done in 2020 he has an abnormal CT angiogram.  With the existing coronary disease it may be better to do an in lab study. 3. Morbid obesity patient weighs 254pounds needs to work on diet and exercise once his cardiac status is situated. 4. Hypertension we will continue to monitor this and will be worked up per his primary care doctor.  General Counseling: I have discussed the findings of the evaluation and examination with [facility].  I have also discussed any further diagnostic evaluation thatmay be needed or ordered today. Joel verbalizes understanding of the findings of todays visit. We also reviewed his medications today and discussed drug interactions and side effects including but not limited excessive drowsiness and altered mental states. We also discussed that there is always a risk not just to him but also people around him. he has been encouraged to call the office with any questions or concerns that should arise related to todays visit.  No orders of the defined types were placed in this encounter.    Time spent: 55  I have personally obtained a history, examined the patient, evaluated laboratory and imaging results, formulated the assessment and plan and placed orders.    Yevonne Pax, MD [facility] Pulmonary and Critical Care Sleep medicine

## 2020-03-04 ENCOUNTER — Encounter (INDEPENDENT_AMBULATORY_CARE_PROVIDER_SITE_OTHER): Payer: BLUE CROSS/BLUE SHIELD | Admitting: Internal Medicine

## 2020-03-04 DIAGNOSIS — G4733 Obstructive sleep apnea (adult) (pediatric): Secondary | ICD-10-CM

## 2020-03-05 NOTE — Procedures (Signed)
SLEEP MEDICAL CENTER  Polysomnogram Report Part I                                                               Phone: [phone] Fax: [phone]  Patient Name: Gregory Hancock, Gregory Hancock Acquisition number: [id number]  Date of Birth: [birth year] Acquisition Date: 03/04/2020  Referring Physician: Leeanne Deed, NP     History: The patient is a 44 year old male who was referred for evaluation of possible sleep apnea. Medical History: hypertension, asthma.  Medications: Lipitor, Hygroton, Singulair, Zantac.  Procedure: This routine overnight polysomnogram was performed on the [facility] 5 using the standard diagnostic protocol. This included 6 channels of EEG, 2 channels of EOG, chin EMG, bilateral anterior tibialis EMG, nasal/oral thermistor, PTAF (nasal pressure transducer), chest and abdominal wall movements, EKG, and pulse oximetry.  Description: The total recording time was 413.8 minutes. The total sleep time was 342.3 minutes. There were a total of 44.0 minutes of wakefulness after sleep onset for a?reduced?sleep efficiency of 82.7%. The latency to sleep onset was within normal limits?at 27.5 minutes. The R sleep onset latency was within normal limits at 105.0 minutes.?? Sleep parameters, as a percentage of the total sleep time, demonstrated 3.8% of sleep was in N1 sleep, 74.4% N2, 2.5% N3 and 19.3% R sleep. There were a total of Hancock arousals for an arousal index of 5.4 arousals per hour of sleep that was normal.???  Respiratory monitoring demonstrated intermittent moderate degree of snoring in all positions. There were 50 apneas and hypopneas for an Apnea Hypopnea Index of 8.8 apneas and hypopneas per hour of sleep. The REM related apnea hypopnea index was 40.0/hr of REM sleep compared to a NREM AHI of 1.1/hr. The Respiratory Disturbance Index, which includes 1 respiratory effort related arousals (RERAs), was 8.9 respiratory events per hour of sleep.  The average duration of the respiratory  events was 30.4 seconds with a maximum duration of 74.0 seconds. The respiratory events occurred in all positions. The respiratory events were associated with peripheral oxygen desaturations on the average to 91%. The lowest oxygen desaturation associated with a respiratory event was 77%. Additionally, the baseline oxygen saturation during wakefulness was 98%, during NREM sleep averaged 97%, and during REM sleep averaged  97%. The total duration of oxygen < 90% was 1.3 minutes.  Cardiac monitoring- did not demonstrate transient cardiac decelerations associated with the apneas. There were no significant cardiac rhythm irregularities.   Periodic limb movement monitoring- demonstrated that there were 106 periodic limb movements for a periodic limb movement index of 18.6 periodic limb movements per hour of sleep. Quasi-periodic limb movements were observed during periods of wakefulness.    Impression: ???This routine overnight polysomnogram demonstrated significant REM-related obstructive sleep apnea with an overall Apnea Hypopnea Index of 8.8 apneas and hypopneas per hour of sleep, while the respiratory disturbance index, which increased to 40.0 in REM sleep.  There was a significantly elevated periodic limb movement index of 18.6 periodic limb movements per hour of sleep. In addition, quasi-periodic limb movements were observed during periods of wakefulness. Sometimes these limb movements subside once the apnea is controlled. Clinical correlation is suggested.  There was a reduced sleep efficiency with???increased awakenings?and a reduced percentage of slow wave sleep.  These findings would appear to be due to the combination of obstructive sleep apnea and periodic limb movements.   ?Recommendations:    1. This sleep study demonstrated presence of MILD sleep apnea with an overall AHI of 8.8 per hour. The AHI during REM sleep was SEVERE with an AHI of 40.0 per hour 2. In addition there is noted severe  oxygen desaturations with a low saturation of 77%. 3. Average heart rate was also noted to be 47bpm 4. A CPAP titration would be recommended for the sleep apnea.  5. Would recommend weight loss in a patient with a BMI of 33.5.  6. Alternative treatment options may include an oral appliance, a nasal resistance device or ENT surgery in the appropriate clinical context. ?    Yevonne Pax, MD, [facility] Diplomate ABMS Pulmonary, Critical Care and Sleep Medicine  Electronically reviewed and digitally signed          S[facility]EP [facility]ICAL CENTER Polysomnogram Report Part II  Phone: [phone] Fax: [phone]  Patient last name Gregory Hancock Neck Size   17 in. Acquisition [id number]  Patient first name Gregory Hancock Weight 240.0 lbs. Started 03/04/2020 at 10:58:15 PM  Birth date Gregory Hancock, Gregory Hancock Height 71.0 in. Stopped 03/05/2020 at 5:56:33 AM  Age 44 BMI 33.5 lb/in2 Duration 413.8  Study Type Adult      Report generated by: Hampton Abbot, RPSGT Sleep Data: Lights Out: 11:02:45 PM Sleep Onset: 11:30:15 PM  Lights On: 5:56:33 AM Sleep Efficiency: 82.7 %  Total Recording Time: 413.8 min Sleep Latency (from Lights Off) 27.5 min  Total Sleep Time (TST): 342.3 min R Latency (from Sleep Onset): 105.0 min  Sleep Period Time: 386.3 min Total number of awakenings: 22  Wake during sleep: 44.0 min Wake After Sleep Onset (WASO): 44.0 min   Sleep Data:         Arousal Summary: Stage  Latency from lights out (min) Latency from sleep onset (min) Duration (min) % Total Sleep Time  Normal values  N 1 27.5 0.0 13.0 3.8 (5%)  N 2 30.0 2.5 254.8 74.4 (50%)  N 3 83.0 55.5 8.5 2.5 (20%)  R 132.5 105.0 66.0 19.3 (25%)    Number Index  Spontaneous 37 6.5  Apneas & Hypopneas 5 0.9  RERAs 1 0.2       (Apneas & Hypopneas & RERAs)  (6) (1.1)  Limb Movement 4 0.7  Snore 0 0.0  TOTAL 47 8.2      Respiratory Data:  CA OA MA Apnea Hypopnea* A+ H RERA Total  Number 0 14 0 14 36 50 1 51  Mean Dur (sec) 0.0 19.8 0.0 19.8  34.8 30.6 22.0 30.4  Max Dur (sec) 0.0 41.0 0.0 41.0 74.0 74.0 22.0 74.0  Total Dur (min) 0.0 4.6 0.0 4.6 20.9 25.5 0.4 25.9  % of TST 0.0 1.3 0.0 1.3 6.1 7.4 0.1 7.6  Index (#/h TST) 0.0 2.5 0.0 2.5 6.3 8.8 0.2 8.9  *Hypopneas scored based on 4% or greater desaturation.  Sleep Stage:        REM NREM TST  AHI 40.0 1.1 8.8  RDI 40.9 1.1 8.9            Body Position Data:  Sleep (min) TST (%) REM (min) NREM (min) CA (#) OA (#) MA (#) HYP (#) AHI (#/h) RERA (#) RDI (#/h) Desat (#)  Supine 42.1 12.30 4.6 37.5 0 0 0 6 8.6 1 10.0 12  Non-Supine 300.20 87.70 61.40 238.80 0.00 14.00 0.00  30.00 8.79 0 8.79 61.00  Left: 188.1 54.95 37.3 150.8 0 4 0 21 8.0 0 8.0 40  Right: 112.1 32.75 24.1 88.0 0 10 0 9 10.2 0 10.2 21      Snoring: Total number of snoring episodes  0  Total time with snoring    min (   % of sleep)   Oximetry Distribution:             WK REM NREM TOTAL  Average (%)   98 97 97 97  < 90% 0.0 1.3 0.0 1.3  < 80% 0.0 0.1 0.0 0.1  < 70% 0.0 0.0 0.0 0.0  # of Desaturations* 3 53 17 73  Desat Index (#/hour) 2.5 48.2 3.7 12.8  Desat Max (%) 5 20 6 20   Desat Max Dur (sec) 118.0 96.0 103.0 118.0  Approx Min O2 during sleep 77  Approx min O2 during a respiratory event 77  Was Oxygen added (Y/N) and final rate No:   0 LPM  *Desaturations based on 3% or greater drop from baseline.   Cheyne Stokes Breathing: None Present     Heart Rate Summary:  Average Heart Rate During Sleep 46.6 bpm      Highest Heart Rate During Sleep (95th %) 88.0 bpm      Highest Heart Rate During Sleep 240 bpm (artifact)  Highest Heart Rate During Recording (TIB) 255 bpm (artifact)   Heart Rate Observations: Event Type # Events   Bradycardia 0 Lowest HR Scored: N/A  Sinus Tachycardia During Sleep 0 Highest HR Scored: N/A  Narrow Complex Tachycardia 0 Highest HR Scored: N/A  Wide Complex Tachycardia 0 Highest HR Scored: N/A  Asystole 0 Longest Pause: N/A  Atrial Fibrillation 0  Duration Longest Event: N/A  Other Arrythmias  No Type:    Periodic Limb Movement Data: (Primary legs unless otherwise noted) Total # Limb Movement 131 Limb Movement Index 23.0  Total # PLMS 106 PLMS Index 18.6  Total # PLMS Arousals 3 PLMS Arousal Index 0.5  Percentage Sleep Time with PLMS 57.22min (16.7 % sleep)  Mean Duration limb movements (secs) 285.8

## 2020-03-10 DIAGNOSIS — E1169 Type 2 diabetes mellitus with other specified complication: Secondary | ICD-10-CM | POA: Diagnosis not present

## 2020-03-27 DIAGNOSIS — H6122 Impacted cerumen, left ear: Secondary | ICD-10-CM | POA: Diagnosis not present

## 2020-03-27 DIAGNOSIS — H938X2 Other specified disorders of left ear: Secondary | ICD-10-CM | POA: Diagnosis not present

## 2020-04-09 DIAGNOSIS — H6122 Impacted cerumen, left ear: Secondary | ICD-10-CM | POA: Diagnosis not present

## 2020-05-12 ENCOUNTER — Telehealth: Payer: Self-pay

## 2020-05-12 NOTE — Telephone Encounter (Signed)
Left a message and asked pt to call back and schedule follow up from December with dr Freda Munro for sleep study results. Gregory Hancock

## 2020-09-08 DIAGNOSIS — I10 Essential (primary) hypertension: Secondary | ICD-10-CM | POA: Diagnosis not present

## 2020-09-08 DIAGNOSIS — E782 Mixed hyperlipidemia: Secondary | ICD-10-CM | POA: Diagnosis not present

## 2020-09-08 DIAGNOSIS — E1169 Type 2 diabetes mellitus with other specified complication: Secondary | ICD-10-CM | POA: Diagnosis not present

## 2020-09-08 DIAGNOSIS — R000 Tachycardia, unspecified: Secondary | ICD-10-CM | POA: Diagnosis not present

## 2020-09-09 DIAGNOSIS — Z23 Encounter for immunization: Secondary | ICD-10-CM | POA: Diagnosis not present

## 2020-09-09 DIAGNOSIS — E1169 Type 2 diabetes mellitus with other specified complication: Secondary | ICD-10-CM | POA: Diagnosis not present

## 2020-09-09 DIAGNOSIS — E782 Mixed hyperlipidemia: Secondary | ICD-10-CM | POA: Diagnosis not present

## 2020-09-28 DIAGNOSIS — Z20822 Contact with and (suspected) exposure to covid-19: Secondary | ICD-10-CM | POA: Diagnosis not present

## 2020-10-06 DIAGNOSIS — K219 Gastro-esophageal reflux disease without esophagitis: Secondary | ICD-10-CM | POA: Diagnosis not present

## 2020-11-05 DIAGNOSIS — R931 Abnormal findings on diagnostic imaging of heart and coronary circulation: Secondary | ICD-10-CM | POA: Diagnosis not present

## 2020-11-11 ENCOUNTER — Inpatient Hospital Stay: Payer: BLUE CROSS/BLUE SHIELD

## 2020-11-11 DIAGNOSIS — I2510 Atherosclerotic heart disease of native coronary artery without angina pectoris: Secondary | ICD-10-CM | POA: Insufficient documentation

## 2020-11-11 DIAGNOSIS — I25110 Atherosclerotic heart disease of native coronary artery with unstable angina pectoris: Secondary | ICD-10-CM | POA: Diagnosis not present

## 2020-11-11 DIAGNOSIS — E782 Mixed hyperlipidemia: Secondary | ICD-10-CM | POA: Diagnosis not present

## 2020-11-11 DIAGNOSIS — I10 Essential (primary) hypertension: Secondary | ICD-10-CM | POA: Diagnosis not present

## 2020-11-11 LAB — GLUCOSE, POC: Glucose (POC): 119 mg/dL — ABNORMAL HIGH (ref 65–117)

## 2020-11-11 LAB — POCT GLUCOSE: POC Glucose: 119 mg/dL — ABNORMAL HIGH (ref 65–117)

## 2020-11-11 MED ORDER — VERAPAMIL 2.5 MG/ML IV
2.5 mg/mL | INTRAVENOUS | Status: AC
Start: 2020-11-11 — End: ?

## 2020-11-11 MED ORDER — HEPARIN (PORCINE) IN NS (PF) 1,000 UNIT/500 ML IV
1000 unit/500 mL | INTRAVENOUS | Status: AC | PRN
Start: 2020-11-11 — End: 2020-11-11
  Administered 2020-11-11: 12:00:00

## 2020-11-11 MED ORDER — FENTANYL CITRATE (PF) 50 MCG/ML IJ SOLN
50 mcg/mL | INTRAMUSCULAR | Status: AC
Start: 2020-11-11 — End: ?

## 2020-11-11 MED ORDER — LIDOCAINE HCL 1 % (10 MG/ML) IJ SOLN
10 mg/mL (1 %) | INTRAMUSCULAR | Status: DC | PRN
Start: 2020-11-11 — End: 2020-11-11
  Administered 2020-11-11: 12:00:00 via INTRADERMAL

## 2020-11-11 MED ORDER — IOPAMIDOL 76 % IV SOLN
76 % | INTRAVENOUS | Status: DC | PRN
Start: 2020-11-11 — End: 2020-11-11
  Administered 2020-11-11 (×3)

## 2020-11-11 MED ORDER — VERAPAMIL 2.5 MG/ML IV
2.5 mg/mL | INTRAVENOUS | Status: DC | PRN
Start: 2020-11-11 — End: 2020-11-11
  Administered 2020-11-11: 12:00:00 via INTRA_ARTERIAL

## 2020-11-11 MED ORDER — NITROGLYCERIN 0.1 MG/ML (100 MCG/ML) D5W COMPOUNDED INJECTION
0.1 mg/mL | INTRAVENOUS | Status: DC | PRN
Start: 2020-11-11 — End: 2020-11-11
  Administered 2020-11-11: 12:00:00

## 2020-11-11 MED ORDER — NITROGLYCERIN 0.1 MG/ML (100 MCG/ML) D5W COMPOUNDED INJECTION
0.1 mg/mL | INTRAVENOUS | Status: AC
Start: 2020-11-11 — End: ?

## 2020-11-11 MED ORDER — FENTANYL CITRATE (PF) 50 MCG/ML IJ SOLN
50 mcg/mL | INTRAMUSCULAR | Status: DC | PRN
Start: 2020-11-11 — End: 2020-11-11
  Administered 2020-11-11: 12:00:00 via INTRAVENOUS

## 2020-11-11 MED ORDER — HEPARIN (PORCINE) IN NS (PF) 1,000 UNIT/500 ML IV
1000 unit/500 mL | INTRAVENOUS | Status: AC
Start: 2020-11-11 — End: ?

## 2020-11-11 MED ORDER — HEPARIN (PORCINE) 1,000 UNIT/ML IJ SOLN
1000 unit/mL | INTRAMUSCULAR | Status: DC | PRN
Start: 2020-11-11 — End: 2020-11-11
  Administered 2020-11-11: 12:00:00

## 2020-11-11 MED ORDER — MIDAZOLAM 1 MG/ML INJECTION (NDG ONLY)
1 mg/mL | INTRAMUSCULAR | Status: AC
Start: 2020-11-11 — End: ?

## 2020-11-11 MED ORDER — MIDAZOLAM 1 MG/ML IJ SOLN
1 mg/mL | INTRAMUSCULAR | Status: DC | PRN
Start: 2020-11-11 — End: 2020-11-11
  Administered 2020-11-11 (×2): via INTRAVENOUS

## 2020-11-11 MED ORDER — SODIUM CHLORIDE 0.9 % IV
INTRAVENOUS | Status: AC | PRN
Start: 2020-11-11 — End: 2020-11-11
  Administered 2020-11-11: 12:00:00 via INTRAVENOUS

## 2020-11-11 MED FILL — HEPARIN (PORCINE) IN NS (PF) 1,000 UNIT/500 ML IV: 1000 unit/500 mL | INTRAVENOUS | Qty: 1500

## 2020-11-11 MED FILL — MIDAZOLAM 1 MG/ML IJ SOLN: 1 mg/mL | INTRAMUSCULAR | Qty: 5

## 2020-11-11 MED FILL — FENTANYL CITRATE (PF) 50 MCG/ML IJ SOLN: 50 mcg/mL | INTRAMUSCULAR | Qty: 2

## 2020-11-11 MED FILL — NITROGLYCERIN 0.1 MG/ML (100 MCG/ML) D5W COMPOUNDED INJECTION: 0.1 mg/mL | INTRAVENOUS | Qty: 10

## 2020-11-11 MED FILL — VERAPAMIL 2.5 MG/ML IV: 2.5 mg/mL | INTRAVENOUS | Qty: 2

## 2020-11-11 NOTE — Progress Notes (Signed)
Cardiac Cath Lab Recovery Arrival Note:      Isaac Schaefer arrived to Cardiac Cath Lab, Recovery Area. Staff introduced to patient. Patient identifiers verified with NAME and DATE OF BIRTH. Procedure verified with patient. Consent forms reviewed and signed by patient or authorized representative and verified. Allergies verified.     Patient and family oriented to department. Patient and family informed of procedure and plan of care.     Questions answered with review. Patient prepped for procedure, per orders from physician, prior to arrival.    Patient on cardiac monitor, non-invasive blood pressure, SPO2 monitor. On RA. Patient is A&Ox 4. Patient reports no complaints.     Patient in stretcher, in low position, with side rails up, call bell within reach, patient instructed to call if assistance as needed.    Patient prep in: [facility] Recovery Area, [place] [facility].   Patient family has pager # none  Family in: brother in lobby.   Prep by: Shelton Silvas and Suszanne Conners

## 2020-11-11 NOTE — H&P (Signed)
H&P  by Marrian Salvage, MD at 11/11/20 [phone]                Author: Marrian Salvage, MD  Service: Cardiology  Author Type: Physician       Filed: 11/11/20 0728  Date of Service: 11/11/20 0723  Status: Signed          Editor: Marrian Salvage, MD (Physician)                       [facility]   [address]   [place], [place] [address]   [phone]   [email or web address]          CARDIOLOGY CONSULTATION          Date of  Admission: 11/11/2020  6:39 AM       Admission type:Elective        Attending Provider: Carla Drape*   Cardiology Provider:          Subjective:        Isaac Schaefer is a 45 y.o. male is here for cardiac cath. Developed progressive substernal chest burning and and discomfort few months ago. Had normal stress test earlier this year. PCP in [place] got calcium score and study revealed calcium score of > 400.  No previous cardia history. Symptoms gradually progressive. Cardiac cath recommended. He denies dyspnea, palpitations, irregular heart beats, near-syncope, syncope, fatigue, orthopnea, paroxysmal nocturnal dyspnea, claudication, lower extremity edema,  tachypnea. Previous treatment/evaluation includes  stress test and calcium score.  Cardiac risk factors: family history, diabetes mellitus, male gender, hypertension.           Patient Active Problem List           Diagnosis  Date Noted         ?  Coronary artery disease involving native coronary artery of native heart with unstable angina pectoris (HCC)  11/11/2020         ?  Coronary artery calcification seen on CAT scan  11/11/2020         None     Past Medical History:        Diagnosis  Date         ?  Asthma       ?  Diabetes (HCC)       ?  Hypertension           ?  Sleep apnea             Social History          Socioeconomic History         ?  Marital status:  MARRIED       Tobacco Use         ?  Smoking status:  Never     ?  Smokeless tobacco:  Never        Substance and Sexual Activity         ?  Alcohol use:  Never         ?  Drug use:  Never        No Known Allergies    History reviewed. No pertinent family history.      No current facility-administered medications for this encounter.            Review of Symptoms:    11 systems reviewed, negative other than as stated in the HPI  Objective:         Visit Vitals      BP  (!) 159/103 (BP 1 Location: Left arm, BP Patient Position: At rest)     Pulse  97     Temp  98.3 ??F (36.8 ??C)     Resp  18     Ht  5\' 11"  (1.803 m)     Wt  232 lb (105.2 kg)     SpO2  99%        BMI  32.36 kg/m??           Physical Exam       General: Well developed, in no acute distress, cooperative and alert   HEENT: No carotid bruits, no JVD, trach is midline. Neck Supple, PERRL, EOM intact.   Heart:  Normal S1/S2 negative S3 or S4. Regular, no murmur, gallop or rub.    Respiratory: Clear bilaterally, no wheezing or rales   Abdomen:   Soft, non-tender, bowel sounds are active.    Extremities:  No edema, no cyanosis, atraumatic.    Neuro: A&Ox3, speech clear.    Skin: Skin color is normal. No rashes or lesions. Non diaphoretic   Vascular: 2+ pulses         Assessment:          Active Problems:     Coronary artery disease involving native coronary artery of native heart with unstable angina pectoris (HCC) (11/11/2020)        Coronary artery calcification seen on CAT scan (11/11/2020)               Plan:        For cardiac cath today. Further recommendations afterwards. I discussed the risks/benefits/alternatives of the procedure with the patient. Risks include (but are not limited to) bleeding, infection, cva/mi/tamponade/death. The patient understands and  agrees to proceed.          11/13/2020, MD

## 2020-11-11 NOTE — Progress Notes (Signed)
Pt given discharge instructions. Questions answered.  Right wrist dressing dry and intact.  Up walking without complaints.  No voiced complaints of CP or SOB.  Discharged via wheelchair to car with family.

## 2020-11-11 NOTE — Progress Notes (Signed)
Primary Nurse Sarah Puryear and barbara, RN performed a dual skin assessment on this patient No impairment noted  Braden score is 23; meplix on sacrum

## 2020-11-20 ENCOUNTER — Encounter

## 2020-12-17 DIAGNOSIS — E1169 Type 2 diabetes mellitus with other specified complication: Secondary | ICD-10-CM | POA: Diagnosis not present

## 2020-12-17 DIAGNOSIS — I10 Essential (primary) hypertension: Secondary | ICD-10-CM | POA: Diagnosis not present

## 2020-12-17 DIAGNOSIS — E782 Mixed hyperlipidemia: Secondary | ICD-10-CM | POA: Diagnosis not present

## 2021-06-10 DIAGNOSIS — K219 Gastro-esophageal reflux disease without esophagitis: Secondary | ICD-10-CM | POA: Diagnosis not present

## 2021-06-10 DIAGNOSIS — E1169 Type 2 diabetes mellitus with other specified complication: Secondary | ICD-10-CM | POA: Diagnosis not present

## 2021-06-10 DIAGNOSIS — E782 Mixed hyperlipidemia: Secondary | ICD-10-CM | POA: Diagnosis not present

## 2021-08-05 DIAGNOSIS — H2513 Age-related nuclear cataract, bilateral: Secondary | ICD-10-CM | POA: Diagnosis not present

## 2021-08-05 DIAGNOSIS — H35713 Central serous chorioretinopathy, bilateral: Secondary | ICD-10-CM | POA: Diagnosis not present

## 2021-08-05 DIAGNOSIS — H524 Presbyopia: Secondary | ICD-10-CM | POA: Diagnosis not present

## 2021-08-05 DIAGNOSIS — E119 Type 2 diabetes mellitus without complications: Secondary | ICD-10-CM | POA: Diagnosis not present

## 2021-08-26 DIAGNOSIS — R0789 Other chest pain: Secondary | ICD-10-CM | POA: Diagnosis not present

## 2021-08-26 DIAGNOSIS — Z87892 Personal history of anaphylaxis: Secondary | ICD-10-CM | POA: Diagnosis not present

## 2022-01-13 DIAGNOSIS — E782 Mixed hyperlipidemia: Secondary | ICD-10-CM | POA: Diagnosis not present

## 2022-01-13 DIAGNOSIS — E1169 Type 2 diabetes mellitus with other specified complication: Secondary | ICD-10-CM | POA: Diagnosis not present

## 2022-01-14 DIAGNOSIS — L738 Other specified follicular disorders: Secondary | ICD-10-CM | POA: Diagnosis not present

## 2022-01-14 DIAGNOSIS — E669 Obesity, unspecified: Secondary | ICD-10-CM | POA: Diagnosis not present

## 2022-02-01 ENCOUNTER — Other Ambulatory Visit: Payer: Self-pay | Admitting: Internal Medicine

## 2022-02-01 MED ORDER — METOPROLOL SUCCINATE ER 25 MG PO TB24: 25.0000 mg | ORAL_TABLET | Freq: Every day | ORAL | 6 refills | Status: DC

## 2022-02-03 ENCOUNTER — Other Ambulatory Visit: Payer: Self-pay

## 2022-02-03 MED ORDER — METOPROLOL SUCCINATE ER 25 MG PO TB24: 25.0000 mg | ORAL_TABLET | Freq: Every day | ORAL | 6 refills | Status: DC

## 2022-02-04 DIAGNOSIS — E119 Type 2 diabetes mellitus without complications: Secondary | ICD-10-CM | POA: Diagnosis not present

## 2022-02-04 DIAGNOSIS — K219 Gastro-esophageal reflux disease without esophagitis: Secondary | ICD-10-CM | POA: Diagnosis not present

## 2022-02-04 DIAGNOSIS — Z1211 Encounter for screening for malignant neoplasm of colon: Secondary | ICD-10-CM | POA: Diagnosis not present

## 2022-02-04 DIAGNOSIS — R079 Chest pain, unspecified: Secondary | ICD-10-CM | POA: Diagnosis not present

## 2022-02-04 DIAGNOSIS — I10 Essential (primary) hypertension: Secondary | ICD-10-CM | POA: Diagnosis not present

## 2022-02-04 DIAGNOSIS — M940 Chondrocostal junction syndrome [Tietze]: Secondary | ICD-10-CM | POA: Diagnosis not present

## 2022-02-04 DIAGNOSIS — E782 Mixed hyperlipidemia: Secondary | ICD-10-CM | POA: Diagnosis not present

## 2022-02-25 ENCOUNTER — Other Ambulatory Visit: Payer: Self-pay

## 2022-02-25 MED ORDER — MONTELUKAST SODIUM 10 MG PO TABS: 10.0000 mg | ORAL_TABLET | Freq: Every day | ORAL | 0 refills | Status: DC

## 2022-02-25 MED ORDER — METFORMIN HCL ER 500 MG PO TB24: 500.0000 mg | ORAL_TABLET | Freq: Two times a day (BID) | ORAL | 0 refills | Status: DC

## 2022-03-01 ENCOUNTER — Other Ambulatory Visit: Payer: Self-pay

## 2022-03-03 ENCOUNTER — Other Ambulatory Visit: Payer: Self-pay | Admitting: Internal Medicine

## 2022-03-03 MED ORDER — OMEPRAZOLE 40 MG PO CPDR: 40.0000 mg | DELAYED_RELEASE_CAPSULE | Freq: Every day | ORAL | 6 refills | Status: DC

## 2022-03-16 ENCOUNTER — Other Ambulatory Visit: Payer: Self-pay

## 2022-03-16 ENCOUNTER — Encounter (HOSPITAL_BASED_OUTPATIENT_CLINIC_OR_DEPARTMENT_OTHER): Payer: Self-pay | Admitting: Emergency Medicine

## 2022-03-16 ENCOUNTER — Emergency Department (HOSPITAL_BASED_OUTPATIENT_CLINIC_OR_DEPARTMENT_OTHER)
Admission: EM | Admit: 2022-03-16 | Discharge: 2022-03-16 | Disposition: A | Discharge status: Home / Self Care | Payer: BC Managed Care – PPO | Attending: Emergency Medicine | Admitting: Emergency Medicine

## 2022-03-16 DIAGNOSIS — Z79899 Other long term (current) drug therapy: Secondary | ICD-10-CM | POA: Diagnosis not present

## 2022-03-16 DIAGNOSIS — I10 Essential (primary) hypertension: Secondary | ICD-10-CM | POA: Diagnosis not present

## 2022-03-16 DIAGNOSIS — R002 Palpitations: Secondary | ICD-10-CM | POA: Insufficient documentation

## 2022-03-16 DIAGNOSIS — R008 Other abnormalities of heart beat: Secondary | ICD-10-CM | POA: Diagnosis not present

## 2022-03-16 DIAGNOSIS — I2510 Atherosclerotic heart disease of native coronary artery without angina pectoris: Secondary | ICD-10-CM | POA: Diagnosis not present

## 2022-03-16 DIAGNOSIS — R0602 Shortness of breath: Secondary | ICD-10-CM | POA: Diagnosis not present

## 2022-03-16 DIAGNOSIS — E876 Hypokalemia: Secondary | ICD-10-CM | POA: Diagnosis not present

## 2022-03-16 LAB — CBC WITH DIFFERENTIAL/PLATELET
Abs Immature Granulocytes: 0.01 10*3/uL (ref 0.00–0.07)
Basophils Absolute: 0 10*3/uL (ref 0.0–0.1)
Basophils Relative: 0 %
Eosinophils Absolute: 0.1 10*3/uL (ref 0.0–0.5)
Eosinophils Relative: 2 %
HCT: 47.6 % (ref 39.0–52.0)
Hemoglobin: 15.9 g/dL (ref 13.0–17.0)
Immature Granulocytes: 0 %
Lymphocytes Relative: 34 %
Lymphs Abs: 2.3 10*3/uL (ref 0.7–4.0)
MCH: 28.1 pg (ref 26.0–34.0)
MCHC: 33.4 g/dL (ref 30.0–36.0)
MCV: 84.1 fL (ref 80.0–100.0)
Monocytes Absolute: 0.7 10*3/uL (ref 0.1–1.0)
Monocytes Relative: 10 %
Neutro Abs: 3.7 10*3/uL (ref 1.7–7.7)
Neutrophils Relative %: 54 %
Platelets: 260 10*3/uL (ref 150–400)
RBC: 5.66 MIL/uL (ref 4.22–5.81)
RDW: 12.7 % (ref 11.5–15.5)
WBC: 6.8 10*3/uL (ref 4.0–10.5)
nRBC: 0 % (ref 0.0–0.2)

## 2022-03-16 LAB — MAGNESIUM: Magnesium: 2.3 mg/dL (ref 1.7–2.4)

## 2022-03-16 LAB — BASIC METABOLIC PANEL
Anion gap: 9 (ref 5–15)
BUN: 17 mg/dL (ref 6–20)
CO2: 27 mmol/L (ref 22–32)
Calcium: 9.4 mg/dL (ref 8.9–10.3)
Chloride: 100 mmol/L (ref 98–111)
Creatinine, Ser: 0.96 mg/dL (ref 0.61–1.24)
GFR, Estimated: >60 mL/min (ref >60)
Glucose, Bld: 111 mg/dL — ABNORMAL HIGH (ref 70–99)
Potassium: 3.2 mmol/L — ABNORMAL LOW (ref 3.5–5.1)
Sodium: 136 mmol/L (ref 135–145)

## 2022-03-16 LAB — TROPONIN I (HIGH SENSITIVITY)
Troponin I (High Sensitivity): 3 ng/L (ref <18)
Troponin I (High Sensitivity): 3 ng/L (ref <18)

## 2022-03-16 MED ORDER — ASPIRIN 81 MG PO CHEW
324.0000 mg | CHEWABLE_TABLET | Freq: Once | ORAL | Status: AC
  Administered 2022-03-16: 324 mg via ORAL
  Filled 2022-03-16: qty 4

## 2022-03-16 MED ORDER — POTASSIUM CHLORIDE CRYS ER 20 MEQ PO TBCR
40.0000 meq | EXTENDED_RELEASE_TABLET | Freq: Once | ORAL | Status: AC
  Administered 2022-03-16: 40 meq via ORAL
  Filled 2022-03-16: qty 2

## 2022-03-16 MED ORDER — POTASSIUM CHLORIDE CRYS ER 20 MEQ PO TBCR: 20.0000 meq | EXTENDED_RELEASE_TABLET | Freq: Every day | ORAL | 0 refills | Status: AC

## 2022-03-16 NOTE — ED Notes (Signed)
Pt on monitor 

## 2022-03-16 NOTE — Discharge Instructions (Addendum)
Please discuss with your cardiologist whether you should adjust your dose of metoprolol, and whether you should have a heart monitor to see what is happening when your heart is going fast.

## 2022-03-16 NOTE — ED Triage Notes (Signed)
Pt states was asleep and woke up with high blood pressure. Noticed heart beat and some throat discomfort. Has been in contact with his cardiologist and was instructed if BP is high to take a second dose of metoprolol, witch he did yesterday and today.

## 2022-03-16 NOTE — ED Provider Notes (Signed)
[facility] EMERGENCY DEPARTMENT Provider Note   CSN: [id number] Arrival date & time: 03/16/22  0048     History  Chief complaint: Elevated blood pressure  Gregory Hancock is a 46 y.o. male.  The history is provided by the patient.  He has history of hypertension, prediabetes, hyperlipidemia, coronary artery disease and comes in because of waking up with his heart racing and blood pressure elevated.  Last night and tonight, he is awakened with heart rate 120-130.  When he checked his blood pressure, it was 155/95.  He denies chest pain, heaviness, tightness, pressure.  Denies dyspnea, nausea, diaphoresis.  He takes metoprolol 25 mg and his cardiologist had advised him to take an extra dose if his heart was racing.  Today, he had taken a total of 50 mg of metoprolol.   Home Medications Prior to Admission medications   Medication Sig Start Date End Date Taking? Authorizing Provider  atorvastatin (LIPITOR) 10 MG tablet TAKE 1 TABLET BY MOUTH ONCE DAILY 04/10/18   Corky Crafts, MD  chlorthalidone (HYGROTON) 25 MG tablet Take 12.5 mg by mouth daily with breakfast.    [provider]  metFORMIN (GLUCOPHAGE-XR) 500 MG 24 hr tablet Take 1 tablet (500 mg total) by mouth 2 (two) times daily. 02/25/22   Eloisa Northern, MD  metoprolol succinate (TOPROL-XL) 25 MG 24 hr tablet Take 1 tablet (25 mg total) by mouth daily. 02/03/22   Eloisa Northern, MD  montelukast (SINGULAIR) 10 MG tablet Take 1 tablet (10 mg total) by mouth daily. 02/25/22   Eloisa Northern, MD  omeprazole (PRILOSEC) 40 MG capsule Take 1 capsule (40 mg total) by mouth daily. 03/03/22   Eloisa Northern, MD  ranitidine (ZANTAC) 150 MG tablet Take 150 mg by mouth 2 (two) times daily. 1 TABLET  AT BEDTIME    [provider]      Allergies    Advil [ibuprofen]    Review of Systems   Review of Systems  All other systems reviewed and are negative.   Physical Exam Updated Vital Signs BP (!) 133/95 (BP Location: Right  Arm)   Pulse 94   Temp 98 F (36.7 C)   Resp 20   Ht 5\' 11"  (1.803 m)   Wt 105.2 kg   SpO2 96%   BMI 32.36 kg/m  Physical Exam Vitals and nursing note reviewed.   46 year old male, resting comfortably and in no acute distress. Vital signs are significant for mildly elevated blood pressure. Oxygen saturation is 96%, which is normal. Head is normocephalic and atraumatic. PERRLA, EOMI. Oropharynx is clear. Neck is nontender and supple without adenopathy or JVD. Back is nontender and there is no CVA tenderness. Lungs are clear without rales, wheezes, or rhonchi. Chest is nontender. Heart has regular rate and rhythm without murmur. Abdomen is soft, flat, nontender. Extremities have no cyanosis or edema, full range of motion is present. Skin is warm and dry without rash. Neurologic: Mental status is normal, cranial nerves are intact, moves all extremities equally.  ED Results / Procedures / Treatments   Labs (all labs ordered are listed, but only abnormal results are displayed) Labs Reviewed  BASIC METABOLIC PANEL - Abnormal; Notable for the following components:      Result Value   Potassium 3.2 (*)    Glucose, Bld 111 (*)    All other components within normal limits  CBC WITH DIFFERENTIAL/PLATELET  MAGNESIUM  TROPONIN I (HIGH SENSITIVITY)  TROPONIN I (HIGH SENSITIVITY)  EKG EKG Interpretation  Date/Time:  Tuesday March 16 2022 02:36:14 EST Ventricular Rate:  97 PR Interval:  139 QRS Duration: 91 QT Interval:  338 QTC Calculation: 430 R Axis:   56 Text Interpretation: Sinus rhythm Abnormal R-wave progression, early transition Nonspecific T abnormalities, inferior leads ST elevation, consider anterolateral injury When compared with ECG of 04/27/2014, No significant change was found Confirmed by Dione Booze ([id number]) on 03/16/2022 3:16:42 AM  Procedures Procedures  Cardiac monitor shows normal sinus rhythm with occasional PVC, per my interpretation.  Medications  Ordered in ED Medications - No data to display  ED Course/ Medical Decision Making/ A&P                           Medical Decision Making Amount and/or Complexity of Data Reviewed Labs: ordered.  Risk OTC drugs. Prescription drug management.   Palpitations with elevated blood pressure.  I am also concerned that this may be an anginal equivalent.  Palpitations could be from PSVT, sinus tachycardia, paroxysmal atrial fibrillation.  I have ordered ED workup of electrocardiogram, CBC, basic metabolic panel, troponin x 2.  I have ordered a dose of oral aspirin.  I have reviewed his past records and on 11/11/2020 he had a cardiac catheterization which showed a 90% lesion in the first diagonal off of the LAD but it was a small vessel and not amenable to PCI.  He therefore is known to have myocardium at risk.  I have reviewed and interpreted his laboratory test, and my interpretation is mild hypokalemia which is likely diuretic induced, mild elevation of random glucose, normal troponin x 2, normal magnesium level, normal CBC.  I have ordered a dose of oral potassium.  Blood pressure has come down with simple observation.  I am referring him back to his cardiologist to discuss possible adjustment of metoprolol dose, possible obtaining outpatient cardiac monitoring.  I am discharging him with a prescription for potassium.  Final Clinical Impression(s) / ED Diagnoses Final diagnoses:  Palpitations  Diuretic-induced hypokalemia    Rx / DC Orders ED Discharge Orders          Ordered    potassium chloride SA (KLOR-CON M) 20 MEQ tablet  Daily        03/16/22 0504              Dione Booze, MD 03/16/22 [phone]

## 2022-03-17 DIAGNOSIS — K2950 Unspecified chronic gastritis without bleeding: Secondary | ICD-10-CM | POA: Diagnosis not present

## 2022-03-17 DIAGNOSIS — R0789 Other chest pain: Secondary | ICD-10-CM | POA: Diagnosis not present

## 2022-03-17 DIAGNOSIS — K2970 Gastritis, unspecified, without bleeding: Secondary | ICD-10-CM | POA: Diagnosis not present

## 2022-03-17 DIAGNOSIS — K3189 Other diseases of stomach and duodenum: Secondary | ICD-10-CM | POA: Diagnosis not present

## 2022-03-17 DIAGNOSIS — R079 Chest pain, unspecified: Secondary | ICD-10-CM | POA: Diagnosis not present

## 2022-03-17 DIAGNOSIS — K219 Gastro-esophageal reflux disease without esophagitis: Secondary | ICD-10-CM | POA: Diagnosis not present

## 2022-03-17 DIAGNOSIS — K259 Gastric ulcer, unspecified as acute or chronic, without hemorrhage or perforation: Secondary | ICD-10-CM | POA: Diagnosis not present

## 2022-04-02 DIAGNOSIS — R002 Palpitations: Secondary | ICD-10-CM | POA: Diagnosis not present

## 2022-04-02 DIAGNOSIS — E876 Hypokalemia: Secondary | ICD-10-CM | POA: Diagnosis not present

## 2022-04-02 DIAGNOSIS — R0602 Shortness of breath: Secondary | ICD-10-CM | POA: Diagnosis not present

## 2022-04-05 DIAGNOSIS — R002 Palpitations: Secondary | ICD-10-CM | POA: Diagnosis not present

## 2022-04-06 DIAGNOSIS — R002 Palpitations: Secondary | ICD-10-CM | POA: Diagnosis not present

## 2022-04-13 DIAGNOSIS — E785 Hyperlipidemia, unspecified: Secondary | ICD-10-CM | POA: Diagnosis not present

## 2022-04-13 DIAGNOSIS — I10 Essential (primary) hypertension: Secondary | ICD-10-CM | POA: Diagnosis not present

## 2022-04-13 DIAGNOSIS — K219 Gastro-esophageal reflux disease without esophagitis: Secondary | ICD-10-CM | POA: Diagnosis not present

## 2022-04-13 DIAGNOSIS — E119 Type 2 diabetes mellitus without complications: Secondary | ICD-10-CM

## 2022-04-13 DIAGNOSIS — Z23 Encounter for immunization: Secondary | ICD-10-CM | POA: Diagnosis not present

## 2022-04-13 HISTORY — DX: Type 2 diabetes mellitus without complications: E11.9

## 2022-04-20 DIAGNOSIS — I10 Essential (primary) hypertension: Secondary | ICD-10-CM | POA: Diagnosis not present

## 2022-04-20 DIAGNOSIS — E119 Type 2 diabetes mellitus without complications: Secondary | ICD-10-CM | POA: Diagnosis not present

## 2022-04-20 DIAGNOSIS — E785 Hyperlipidemia, unspecified: Secondary | ICD-10-CM | POA: Diagnosis not present

## 2022-05-05 DIAGNOSIS — E119 Type 2 diabetes mellitus without complications: Secondary | ICD-10-CM | POA: Diagnosis not present

## 2022-05-05 DIAGNOSIS — E785 Hyperlipidemia, unspecified: Secondary | ICD-10-CM | POA: Diagnosis not present

## 2022-05-05 DIAGNOSIS — K219 Gastro-esophageal reflux disease without esophagitis: Secondary | ICD-10-CM | POA: Diagnosis not present

## 2022-05-05 DIAGNOSIS — I10 Essential (primary) hypertension: Secondary | ICD-10-CM | POA: Diagnosis not present

## 2022-05-05 DIAGNOSIS — E876 Hypokalemia: Secondary | ICD-10-CM | POA: Diagnosis not present

## 2022-05-15 ENCOUNTER — Encounter (HOSPITAL_BASED_OUTPATIENT_CLINIC_OR_DEPARTMENT_OTHER): Payer: Self-pay | Admitting: Emergency Medicine

## 2022-05-15 ENCOUNTER — Emergency Department (HOSPITAL_BASED_OUTPATIENT_CLINIC_OR_DEPARTMENT_OTHER)
Admission: EM | Admit: 2022-05-15 | Discharge: 2022-05-16 | Disposition: A | Discharge status: Home / Self Care | Payer: BC Managed Care – PPO | Attending: Emergency Medicine | Admitting: Emergency Medicine

## 2022-05-15 ENCOUNTER — Emergency Department (HOSPITAL_BASED_OUTPATIENT_CLINIC_OR_DEPARTMENT_OTHER): Payer: BC Managed Care – PPO

## 2022-05-15 ENCOUNTER — Other Ambulatory Visit: Payer: Self-pay

## 2022-05-15 DIAGNOSIS — Z79899 Other long term (current) drug therapy: Secondary | ICD-10-CM | POA: Diagnosis not present

## 2022-05-15 DIAGNOSIS — R072 Precordial pain: Secondary | ICD-10-CM | POA: Insufficient documentation

## 2022-05-15 DIAGNOSIS — I10 Essential (primary) hypertension: Secondary | ICD-10-CM | POA: Insufficient documentation

## 2022-05-15 DIAGNOSIS — R079 Chest pain, unspecified: Secondary | ICD-10-CM | POA: Diagnosis not present

## 2022-05-15 LAB — CBC
HCT: 47.4 % (ref 39.0–52.0)
Hemoglobin: 15.7 g/dL (ref 13.0–17.0)
MCH: 28 pg (ref 26.0–34.0)
MCHC: 33.1 g/dL (ref 30.0–36.0)
MCV: 84.6 fL (ref 80.0–100.0)
Platelets: 248 10*3/uL (ref 150–400)
RBC: 5.6 MIL/uL (ref 4.22–5.81)
RDW: 12.7 % (ref 11.5–15.5)
WBC: 7.4 10*3/uL (ref 4.0–10.5)
nRBC: 0 % (ref 0.0–0.2)

## 2022-05-15 LAB — BASIC METABOLIC PANEL
Anion gap: 8 (ref 5–15)
BUN: 15 mg/dL (ref 6–20)
CO2: 24 mmol/L (ref 22–32)
Calcium: 9.2 mg/dL (ref 8.9–10.3)
Chloride: 100 mmol/L (ref 98–111)
Creatinine, Ser: 0.89 mg/dL (ref 0.61–1.24)
GFR, Estimated: >60 mL/min (ref >60)
Glucose, Bld: 234 mg/dL — ABNORMAL HIGH (ref 70–99)
Potassium: 3.6 mmol/L (ref 3.5–5.1)
Sodium: 132 mmol/L — ABNORMAL LOW (ref 135–145)

## 2022-05-15 LAB — TROPONIN I (HIGH SENSITIVITY): Troponin I (High Sensitivity): 3 ng/L (ref <18)

## 2022-05-15 NOTE — ED Triage Notes (Signed)
Patient here with high blood pressure and chest pain. He states that it comes and goes.  No nausea or vomiting.  He states that he was here for same a couple of months ago.

## 2022-05-16 LAB — TROPONIN I (HIGH SENSITIVITY): Troponin I (High Sensitivity): 3 ng/L (ref <18)

## 2022-05-16 NOTE — ED Notes (Signed)
IV removed, skin clean/dry/intact. No bleeding.

## 2022-05-16 NOTE — ED Provider Notes (Signed)
[facility] Provider Note   CSN: [id number] Arrival date & time: 05/15/22  2125     History  Chief Complaint  Patient presents with   Hypertension   Chest Pain    Gregory Hancock is a 47 y.o. male.  The history is provided by the patient.  Chest Pain Pain location:  L chest Pain quality: sharp   Pain quality: not radiating   Pain radiates to:  Does not radiate Pain severity:  Moderate Onset quality:  Sudden Duration: lasts a second or 2 at a time but has had multiple episodes of seconds long pain. Timing:  Sporadic Progression:  Unchanged Chronicity:  New Context: at rest   Relieved by:  Nothing Worsened by:  Nothing Ineffective treatments:  None tried Associated symptoms: no cough, no diaphoresis, no heartburn, no lower extremity edema, no palpitations, no shortness of breath, no syncope, no vomiting and no weakness   Risk factors: hypertension and male sex   Patient with HTN presents with multiple episodes of seconds long pain in the left chest.  No associated symptoms.  No n/v/d.  No travel, no leg pain.  No trauma.  No exertional symptoms. BP was elevated on home cuff so he wanted to get checked.       Home Medications Prior to Admission medications   Medication Sig Start Date End Date Taking? Authorizing Provider  atorvastatin (LIPITOR) 10 MG tablet TAKE 1 TABLET BY MOUTH ONCE DAILY 04/10/18   Jettie Booze, MD  chlorthalidone (HYGROTON) 25 MG tablet Take 12.5 mg by mouth daily with breakfast.    [provider]  metFORMIN (GLUCOPHAGE-XR) 500 MG 24 hr tablet Take 1 tablet (500 mg total) by mouth 2 (two) times daily. 02/25/22   Garwin Brothers, MD  metoprolol succinate (TOPROL-XL) 25 MG 24 hr tablet Take 1 tablet (25 mg total) by mouth daily. 02/03/22   Garwin Brothers, MD  montelukast (SINGULAIR) 10 MG tablet Take 1 tablet (10 mg total) by mouth daily. 02/25/22   Garwin Brothers, MD  omeprazole (PRILOSEC) 40 MG capsule  Take 1 capsule (40 mg total) by mouth daily. 03/03/22   Garwin Brothers, MD  potassium chloride SA (KLOR-CON M) 20 MEQ tablet Take 1 tablet (20 mEq total) by mouth daily. [id number]   Delora Fuel, MD  ranitidine (ZANTAC) 150 MG tablet Take 150 mg by mouth 2 (two) times daily. 1 TABLET  AT BEDTIME    [provider]      Allergies    Advil [ibuprofen]    Review of Systems   Review of Systems  Constitutional:  Negative for diaphoresis.  Respiratory:  Negative for cough and shortness of breath.   Cardiovascular:  Positive for chest pain. Negative for palpitations and syncope.  Gastrointestinal:  Negative for heartburn and vomiting.  Neurological:  Negative for weakness.  All other systems reviewed and are negative.   Physical Exam Updated Vital Signs BP 126/80   Pulse 82   Temp 98.2 F (36.8 C) (Oral)   Resp 12   SpO2 97%  Physical Exam Vitals and nursing note reviewed.  Constitutional:      General: He is not in acute distress.    Appearance: He is well-developed. He is not diaphoretic.  HENT:     Head: Normocephalic and atraumatic.     Nose: Nose normal.  Eyes:     Conjunctiva/sclera: Conjunctivae normal.     Pupils: Pupils are equal, round, and reactive to light.  Cardiovascular:     Rate and Rhythm: Normal rate and regular rhythm.     Pulses: Normal pulses.     Heart sounds: Normal heart sounds.  Pulmonary:     Effort: Pulmonary effort is normal.     Breath sounds: Normal breath sounds. No wheezing or rales.  Abdominal:     General: Bowel sounds are normal.     Palpations: Abdomen is soft.     Tenderness: There is no abdominal tenderness. There is no guarding or rebound.  Musculoskeletal:        General: Normal range of motion.     Cervical back: Normal range of motion and neck supple.  Skin:    General: Skin is warm and dry.     Capillary Refill: Capillary refill takes less than 2 seconds.  Neurological:     General: No focal deficit present.     Mental  Status: He is alert and oriented to person, place, and time.     Deep Tendon Reflexes: Reflexes normal.  Psychiatric:        Mood and Affect: Mood normal.        Behavior: Behavior normal.     ED Results / Procedures / Treatments   Labs (all labs ordered are listed, but only abnormal results are displayed) Results for orders placed or performed during the hospital encounter of [id number]  Basic metabolic panel  Result Value Ref Range   Sodium 132 (L) 135 - 145 mmol/L   Potassium 3.6 3.5 - 5.1 mmol/L   Chloride 100 98 - 111 mmol/L   CO2 24 22 - 32 mmol/L   Glucose, Bld 234 (H) 70 - 99 mg/dL   BUN 15 6 - 20 mg/dL   Creatinine, Ser 0.89 0.61 - 1.24 mg/dL   Calcium 9.2 8.9 - 10.3 mg/dL   GFR, Estimated >60 >60 mL/min   Anion gap 8 5 - 15  CBC  Result Value Ref Range   WBC 7.4 4.0 - 10.5 K/uL   RBC 5.60 4.22 - 5.81 MIL/uL   Hemoglobin 15.7 13.0 - 17.0 g/dL   HCT 47.4 39.0 - 52.0 %   MCV 84.6 80.0 - 100.0 fL   MCH 28.0 26.0 - 34.0 pg   MCHC 33.1 30.0 - 36.0 g/dL   RDW 12.7 11.5 - 15.5 %   Platelets 248 150 - 400 K/uL   nRBC 0.0 0.0 - 0.2 %  Troponin I (High Sensitivity)  Result Value Ref Range   Troponin I (High Sensitivity) 3 <18 ng/L  Troponin I (High Sensitivity)  Result Value Ref Range   Troponin I (High Sensitivity) 3 <18 ng/L   DG Chest 2 View  Result Date: 05/15/2022 CLINICAL DATA:  Chest pain. EXAM: CHEST - 2 VIEW COMPARISON:  04/27/2014 FINDINGS: The cardiomediastinal contours are normal. The lungs are clear. Pulmonary vasculature is normal. No consolidation, pleural effusion, or pneumothorax. No acute osseous abnormalities are seen. IMPRESSION: Negative radiographs of the chest. Electronically Signed   By: Keith Rake M.D.   On: 05/15/2022 22:09    EKG Sinus tachycardia 106, normal intervals     Radiology DG Chest 2 View  Result Date: 05/15/2022 CLINICAL DATA:  Chest pain. EXAM: CHEST - 2 VIEW COMPARISON:  04/27/2014 FINDINGS: The cardiomediastinal  contours are normal. The lungs are clear. Pulmonary vasculature is normal. No consolidation, pleural effusion, or pneumothorax. No acute osseous abnormalities are seen. IMPRESSION: Negative radiographs of the chest. Electronically Signed   By: Aurther Loft.D.  On: 05/15/2022 22:09    Procedures Procedures    Medications Ordered in ED Medications - No data to display  ED Course/ Medical Decision Making/ A&P                             Medical Decision Making Patient with multiple episodes of pain lasting 1 to 2 seconds.    Amount and/or Complexity of Data Reviewed Independent Historian: friend    Details: See above  External Data Reviewed: notes.    Details: Previous notes reviewed  Labs: ordered.    Details: All labs reviewed: 2 negative troponins.  White count normal 7.4, normal hemoglobin 15.7, normal platelet count. Sodium 134, normal potassium 3.6, normal creatinine  Radiology: ordered and independent interpretation performed.    Details: Normal CXR ECG/medicine tests: ordered and independent interpretation performed. Decision-making details documented in ED Course.  Risk Risk Details: Patient with a second or 2 of pain.  BP normalized on own without intervention.  PERC 0, wells negative highly doubt PE.  Ruled out for MI in the ED, heart score is 2 low risk for MACE.  Pain lasting a second or 2 is not cardiac and is most consistent with percordial catch.  Stable for discharge.  Strict return.      Final Clinical Impression(s) / ED Diagnoses Final diagnoses:  Precordial pain   Return for intractable cough, coughing up blood, fevers > 100.4 unrelieved by medication, shortness of breath, intractable vomiting, chest pain, shortness of breath, weakness, numbness, changes in speech, facial asymmetry, abdominal pain, passing out, Inability to tolerate liquids or food, cough, altered mental status or any concerns. No signs of systemic illness or infection. The patient is  nontoxic-appearing on exam and vital signs are within normal limits.  I have reviewed the triage vital signs and the nursing notes. Pertinent labs & imaging results that were available during my care of the patient were reviewed by me and considered in my medical decision making (see chart for details). After history, exam, and medical workup I feel the patient has been appropriately medically screened and is safe for discharge home. Pertinent diagnoses were discussed with the patient. Patient was given return precautions Rx / DC Orders ED Discharge Orders     None         Robertha Staples, MD 05/16/22 [id number]

## 2022-05-23 ENCOUNTER — Other Ambulatory Visit: Payer: Self-pay | Admitting: Internal Medicine

## 2022-06-03 ENCOUNTER — Ambulatory Visit: Payer: BC Managed Care – PPO | Attending: Cardiology | Admitting: Cardiology

## 2022-06-03 ENCOUNTER — Encounter: Payer: Self-pay | Admitting: Cardiology

## 2022-06-03 VITALS — BP 130/88 | HR 80 | Ht 71.0 in | Wt 240.0 lb

## 2022-06-03 DIAGNOSIS — E669 Obesity, unspecified: Secondary | ICD-10-CM

## 2022-06-03 DIAGNOSIS — I10 Essential (primary) hypertension: Secondary | ICD-10-CM

## 2022-06-03 DIAGNOSIS — I2510 Atherosclerotic heart disease of native coronary artery without angina pectoris: Secondary | ICD-10-CM

## 2022-06-03 DIAGNOSIS — E119 Type 2 diabetes mellitus without complications: Secondary | ICD-10-CM | POA: Diagnosis not present

## 2022-06-03 DIAGNOSIS — E785 Hyperlipidemia, unspecified: Secondary | ICD-10-CM | POA: Diagnosis not present

## 2022-06-03 MED ORDER — METOPROLOL TARTRATE 100 MG PO TABS: ORAL_TABLET | ORAL | 0 refills | Status: DC

## 2022-06-03 NOTE — Progress Notes (Signed)
Cardiology Consultation:    Date:  06/03/2022   ID:  Gregory Hancock, DOB 1975-07-19, MRN [id number]  PCP:  Wenda Low, MD  Cardiologist:  Jenne Campus, MD   Referring MD: Wenda Low, MD   No chief complaint on file. I would like to have my heart checked  History of Present Illness:    Gregory Hancock is a 47 y.o. male who is being seen today for the evaluation of chest pain at the request of Wenda Low, MD. past medical history significant for type 2 diabetes, essential hypertension, hyperlipidemia in 2022 he had cardiac catheterization done which showed some 90% narrowing of small diagonal branch disease, no intervention has been done at that time.  He also got recently calcium score done which was 22.9.  He comes to me because of a constellation of symptoms he complained of having some chest pain located in the left side of his chest not related to exercise lasting for different duration.  No shortness of breath no sweating associated with this sensation.  He admits that he lives a very sedentary lifestyle.  He does not exercise on the regular basis, he is not on any special diet.  He does not smoke right now.  There is no family history of premature coronary artery disease.  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 No pertinent surgical history.  Current Medications: Current Meds  Medication Sig   atorvastatin (LIPITOR) 20 MG tablet Take 20 mg by mouth daily.   chlorthalidone (HYGROTON) 25 MG tablet Take 12.5 mg by mouth daily with breakfast.   metFORMIN (GLUCOPHAGE-XR) 500 MG 24 hr tablet TAKE ONE TABLET BY MOUTH  TWICE DAILY   metoprolol succinate (TOPROL-XL) 25 MG 24 hr tablet Take 1 tablet (25 mg total) by mouth daily.   metoprolol tartrate (LOPRESSOR) 100 MG tablet Take one tablet 2 hours before cardiac CT for heart greater than 55   montelukast (SINGULAIR) 10 MG tablet TAKE ONE TABLET BY MOUTH ONE TIME DAILY   omeprazole (PRILOSEC) 40 MG capsule Take 1 capsule (40 mg total) by mouth daily. (Patient taking differently: Take 40 mg by mouth daily as needed.)   potassium chloride SA (KLOR-CON M) 20 MEQ tablet Take 1 tablet (20 mEq total) by mouth daily.   valsartan (DIOVAN) 160 MG tablet Take 160 mg by mouth daily.     Allergies:   Advil [ibuprofen] and Semaglutide   Social History   Socioeconomic History   Marital status: Married    Spouse name: Not on file   Number of children: Not on file   Years of education: Not on file   Highest education level: Not on file  Occupational History   Not on file  Tobacco Use   Smoking status: Never   Smokeless tobacco: Never  Substance and Sexual Activity   Alcohol use: No   Drug use: No   Sexual activity: Not on file  Other Topics Concern   Not on file  Social History Narrative   Not on file   Social Determinants of Health   Financial Resource Strain: Not on file  Food Insecurity: Not on file  Transportation  Needs: Not on file  Physical Activity: Not on file  Stress: Not on file  Social Connections: Not on file     Family History: The patient's family history includes Heart attack in his maternal uncle and paternal aunt. ROS:   Please see the history of present illness.    All 14 point review of systems negative except as described per history of present illness.  EKGs/Labs/Other Studies Reviewed:    The following studies were reviewed today: Cardiac catheterization from 2022 showed:  Coronary Findings Diagnostic Dominance: Right Left Main: The vessel was visualized by angiography. The vessel is angiographically normal. Left  Anterior Descending: The vessel is angiographically normal. First Diagonal Branch: The vessel is small. 1st Diag lesion, 90% stenosed. The lesion is type A. Second Diagonal Branch: The vessel is angiographically normal. Left Circumflex: The vessel is angiographically normal. First Obtuse Marginal Branch: The vessel is angiographically normal. Second Obtuse Marginal Branch: The vessel is angiographically normal. Right Coronary Artery: The vessel exhibits minimal luminal irregularities.   EKG:  EKG is  ordered today.  The ekg ordered today demonstrates normal sinus rhythm normal P interval normal QS complex duration fulgent no ST segment changes  Recent Labs: 03/16/2022: Magnesium 2.3 05/15/2022: BUN 15; Creatinine, Ser 0.89; Hemoglobin 15.7; Platelets 248; Potassium 3.6; Sodium 132  Recent Lipid Panel No results found for: "CHOL", "TRIG", "HDL", "CHOLHDL", "VLDL", "LDLCALC", "LDLDIRECT"  Physical Exam:    VS:  BP 130/88 (BP Location: Right Arm, Patient Position: Sitting)   Pulse 80   Ht '5\' 11"'$  (1.803 m)   Wt 240 lb (108.9 kg)   SpO2 97%   BMI 33.47 kg/m     Wt Readings from Last 3 Encounters:  06/03/22 240 lb (108.9 kg)  03/16/22 232 lb (105.2 kg)  02/22/17 254 lb 3.2 oz (115.3 kg)     GEN:  Well nourished, well developed in no acute distress HEENT: Normal NECK: No JVD; No carotid bruits LYMPHATICS: No lymphadenopathy CARDIAC: RRR, no murmurs, no rubs, no gallops RESPIRATORY:  Clear to auscultation without rales, wheezing or rhonchi  ABDOMEN: Soft, non-tender, non-distended MUSCULOSKELETAL:  No edema; No deformity  SKIN: Warm and dry NEUROLOGIC:  Alert and oriented x 3 PSYCHIATRIC:  Normal affect   ASSESSMENT:    1. Coronary artery calcification seen on CAT scan   2. Hypertension, unspecified type   3. Atherosclerosis of native coronary artery of native heart without angina pectoris   4. Type 2 diabetes mellitus without complication, without long-term current use of  insulin ([place])   5. Dyslipidemia   6. Obesity (BMI 30-39.9)    PLAN:    In order of problems listed above:  Coronary artery disease known 90% stenosis of the diagonal branch.  He does have constellation of very atypical symptoms but at the same time he does have multiple risk factors for coronary disease.  We had a long discussion about what to do with the situation I think the most appropriate course of action will be to perform coronary CT angio.  In the meantime we will continue present medication which include antiplatelet therapy, continue statin he is taking 20 mg of Lipitor with last fasting lipid profile from October 2023 showing LDL of 64 and HDL 30. Essential hypertension blood pressure controlled continue present management. Dyslipidemia continue statin. Type 2 diabetes I do see hemoglobin A1c from January 13, 2022 which was 6.1.  Will continue monitoring. We did spend a great deal of time talking about healthy lifestyle need to  exercise on the regular basis which I strongly recommended.   Medication Adjustments/Labs and Tests Ordered: Current medicines are reviewed at length with the patient today.  Concerns regarding medicines are outlined above.  Orders Placed This Encounter  Procedures   CT CORONARY MORPH W/CTA COR W/SCORE W/CA W/CM &/OR WO/CM   Basic Metabolic Panel (BMET)   EKG 12-Lead   Meds ordered this encounter  Medications   metoprolol tartrate (LOPRESSOR) 100 MG tablet    Sig: Take one tablet 2 hours before cardiac CT for heart greater than 55    Dispense:  1 tablet    Refill:  0    Signed, Park Liter, MD, [facility]. 06/03/2022 5:17 PM    [facility]

## 2022-06-03 NOTE — Patient Instructions (Signed)
Medication Instructions:   TAKE: Metoprolol '100mg'$  1 tablet 2 hours prior to CT Scan  HOLD: Glucophage 48 hours after CT scan  *If you need a refill on your cardiac medications before your next appointment, please call your pharmacy*   Lab Work: 3rd floor [place] recommends that you return for lab work in:  1 week prior to [place] need to have labs done when you are fasting.  You can come Monday through Friday 8:00 am to 11:30am and 1:00 to 4:00. You do not need to make an appointment as the order has already been placed.       Testing/Procedures:   Your cardiac CT will be scheduled at one of the below locations:   [facility] [address] [place], [place] [address] [phone]   At [facility], please arrive at the [facility] (Entrance C2) of [facility] 30 minutes prior to test start time. You can use the FREE valet parking offered at entrance C (encouraged to control the heart rate for the test)  Proceed to the [facility] Rad[facility] Department (first floor) to check-in and test prep.  All radiology patients and guests should use entrance C2 at [facility], accessed from [facility], even though the hospital's physical address listed is [address].      Please follow these instructions carefully (unless otherwise directed):  Hold all erectile dysfunction medications at least 3 days (72 hrs) prior to test.  On the Night Before the Test: Be sure to Drink plenty of water. Do not consume any caffeinated/decaffeinated beverages or chocolate 12 hours prior to your test. Do not take any antihistamines 12 hours prior to your test  On the Day of the Test: Drink plenty of water until 1 hour prior to the test. Do not eat any food 4 hours prior to the test. You may take your regular medications prior to the test.  Take metoprolol (Lopressor) two hours prior to test. HOLD  Furosemide/Hydrochlorothiazide morning of the test.       After the Test: Drink plenty of water. After receiving IV contrast, you may experience a mild flushed feeling. This is normal. On occasion, you may experience a mild rash up to 24 hours after the test. This is not dangerous. If this occurs, you can take Benadryl 25 mg and increase your fluid intake. If you experience trouble breathing, this can be serious. If it is severe call 911 IMMEDIATELY. If it is mild, please call our office. If you take any of these medications: Glipizide/Metformin, Avandament, Glucavance, please do not take 48 hours after completing test unless otherwise instructed.  We will call to schedule your test 2-4 weeks out understanding that some insurance companies will need an authorization prior to the service being performed.   For non-scheduling related questions, please contact the cardiac imaging nurse navigator should you have any questions/concerns: Marchia Bond, Cardiac Imaging Nurse Navigator Gordy Clement, Cardiac Imaging Nurse Navigator [facility] Direct Office Dial: [phone]   For scheduling needs, including cancellations and rescheduling, please call [place], [phone].    Follow-Up: At [facility], you and your health needs are our priority.  As part of our continuing mission to provide you with exceptional heart care, we have created designated Provider Care Teams.  These Care Teams include your primary Cardiologist (physician) and Advanced Practice Providers (APPs -  Physician Assistants and Nurse Practitioners) who all  work together to provide you with the care you need, when you need it.  We recommend signing up for the patient portal called "MyChart".  Sign up information is provided on this After Visit Summary.  MyChart is used to connect with patients for Virtual Visits (Telemedicine).  Patients are able to view lab/test results, encounter notes, upcoming  appointments, etc.  Non-urgent messages can be sent to your provider as well.   To learn more about what you can do with MyChart, go to [email or web address].    Your next appointment:   3 month(s)  The format for your next appointment:   In Person  Provider:   Jenne Campus, MD   Other Instructions Cardiac CT Angiogram A cardiac CT angiogram is a procedure to look at the heart and the area around the heart. It may be done to help find the cause of chest pains or other symptoms of heart disease. During this procedure, a substance called contrast dye is injected into the blood vessels in the area to be checked. A large X-ray machine, called a CT scanner, then takes detailed pictures of the heart and the surrounding area. The procedure is also sometimes called a coronary CT angiogram, coronary artery scanning, or CTA. A cardiac CT angiogram allows the health care provider to see how well blood is flowing to and from the heart. The health care provider will be able to see if there are any problems, such as: Blockage or narrowing of the coronary arteries in the heart. Fluid around the heart. Signs of weakness or disease in the muscles, valves, and tissues of the heart. Tell a health care provider about: Any allergies you have. This is especially important if you have had a previous allergic reaction to contrast dye. All medicines you are taking, including vitamins, herbs, eye drops, creams, and over-the-counter medicines. Any blood disorders you have. Any surgeries you have had. Any medical conditions you have. Whether you are pregnant or may be pregnant. Any anxiety disorders, chronic pain, or other conditions you have that may increase your stress or prevent you from lying still. What are the risks? Generally, this is a safe procedure. However, problems may occur, including: Bleeding. Infection. Allergic reactions to medicines or dyes. Damage to other structures or organs. Kidney  damage from the contrast dye that is used. Increased risk of cancer from radiation exposure. This risk is low. Talk with your health care provider about: The risks and benefits of testing. How you can receive the lowest dose of radiation. What happens before the procedure? Wear comfortable clothing and remove any jewelry, glasses, dentures, and hearing aids. Follow instructions from your health care provider about eating and drinking. This may include: For 12 hours before the procedure -- avoid caffeine. This includes tea, coffee, soda, energy drinks, and diet pills. Drink plenty of water or other fluids that do not have caffeine in them. Being well hydrated can prevent complications. For 4-6 hours before the procedure -- stop eating and drinking. The contrast dye can cause nausea, but this is less likely if your stomach is empty. Ask your health care provider about changing or stopping your regular medicines. This is especially important if you are taking diabetes medicines, blood thinners, or medicines to treat problems with erections (erectile dysfunction). What happens during the procedure?  Hair on your chest may need to be removed so that small sticky patches called electrodes can be placed on your chest. These will transmit information that helps to monitor your  heart during the procedure. An IV will be inserted into one of your veins. You might be given a medicine to control your heart rate during the procedure. This will help to ensure that good images are obtained. You will be asked to lie on an exam table. This table will slide in and out of the CT machine during the procedure. Contrast dye will be injected into the IV. You might feel warm, or you may get a metallic taste in your mouth. You will be given a medicine called nitroglycerin. This will relax or dilate the arteries in your heart. The table that you are lying on will move into the CT machine tunnel for the scan. The person  running the machine will give you instructions while the scans are being done. You may be asked to: Keep your arms above your head. Hold your breath. Stay very still, even if the table is moving. When the scanning is complete, you will be moved out of the machine. The IV will be removed. The procedure may vary among health care providers and hospitals. What can I expect after the procedure? After your procedure, it is common to have: A metallic taste in your mouth from the contrast dye. A feeling of warmth. A headache from the nitroglycerin. Follow these instructions at home: Take over-the-counter and prescription medicines only as told by your health care provider. If you are told, drink enough fluid to keep your urine pale yellow. This will help to flush the contrast dye out of your body. Most people can return to their normal activities right after the procedure. Ask your health care provider what activities are safe for you. It is up to you to get the results of your procedure. Ask your health care provider, or the department that is doing the procedure, when your results will be ready. Keep all follow-up visits as told by your health care provider. This is important. Contact a health care provider if: You have any symptoms of allergy to the contrast dye. These include: Shortness of breath. Rash or hives. A racing heartbeat. Summary A cardiac CT angiogram is a procedure to look at the heart and the area around the heart. It may be done to help find the cause of chest pains or other symptoms of heart disease. During this procedure, a large X-ray machine, called a CT scanner, takes detailed pictures of the heart and the surrounding area after a contrast dye has been injected into blood vessels in the area. Ask your health care provider about changing or stopping your regular medicines before the procedure. This is especially important if you are taking diabetes medicines, blood thinners,  or medicines to treat erectile dysfunction. If you are told, drink enough fluid to keep your urine pale yellow. This will help to flush the contrast dye out of your body. This information is not intended to replace advice given to you by your health care provider. Make sure you discuss any questions you have with your health care provider. Document Revised: 11/01/2018 Document Reviewed: 11/01/2018 Elsevier Patient Education  [place].

## 2022-06-10 ENCOUNTER — Telehealth (HOSPITAL_COMMUNITY): Payer: Self-pay | Admitting: *Deleted

## 2022-06-10 NOTE — Telephone Encounter (Signed)
Attempted to call patient regarding upcoming cardiac CT appointment. °Left message on voicemail with name and callback number ° °Xianna Siverling RN Navigator Cardiac Imaging °[facility] °[phone] Office °[phone] Cell ° °

## 2022-06-10 NOTE — Telephone Encounter (Signed)
Patient returning call about his upcoming cardiac imaging study; pt verbalizes understanding of appt date/time, parking situation and where to check in, pre-test NPO status and medications ordered, and verified current allergies; name and call back number provided for further questions should they arise  Gregory Clement RN Navigator Cardiac Imaging [facility] [phone] office [phone] cell  Patient to take 100mg  metoprolol tartrate two hours prior to his cardiac CT scan. He is aware to arrive at 3pm.

## 2022-06-11 ENCOUNTER — Telehealth: Payer: Self-pay | Admitting: Cardiology

## 2022-06-11 ENCOUNTER — Ambulatory Visit (HOSPITAL_COMMUNITY)
Admission: RE | Admit: 2022-06-11 | Discharge: 2022-06-11 | Disposition: A | Discharge status: Home / Self Care | Payer: BC Managed Care – PPO | Source: Ambulatory Visit | Attending: Cardiology | Admitting: Cardiology

## 2022-06-11 DIAGNOSIS — I2510 Atherosclerotic heart disease of native coronary artery without angina pectoris: Secondary | ICD-10-CM | POA: Diagnosis not present

## 2022-06-11 DIAGNOSIS — I10 Essential (primary) hypertension: Secondary | ICD-10-CM | POA: Insufficient documentation

## 2022-06-11 MED ORDER — NITROGLYCERIN 0.4 MG SL SUBL
SUBLINGUAL_TABLET | SUBLINGUAL | Status: AC
  Filled 2022-06-11: qty 2

## 2022-06-11 MED ORDER — METOPROLOL TARTRATE 5 MG/5ML IV SOLN
INTRAVENOUS | Status: AC
  Administered 2022-06-11: 10 mg via INTRAVENOUS
  Filled 2022-06-11: qty 20

## 2022-06-11 MED ORDER — NITROGLYCERIN 0.4 MG SL SUBL
0.8000 mg | SUBLINGUAL_TABLET | Freq: Once | SUBLINGUAL | Status: AC
  Administered 2022-06-11: 0.8 mg via SUBLINGUAL

## 2022-06-11 MED ORDER — IOHEXOL 350 MG/ML SOLN
75.0000 mL | Freq: Once | INTRAVENOUS | Status: AC | PRN
  Administered 2022-06-11: 95 mL via INTRAVENOUS

## 2022-06-11 MED ORDER — METOPROLOL TARTRATE 5 MG/5ML IV SOLN
10.0000 mg | INTRAVENOUS | Status: AC | PRN
  Administered 2022-06-11: 10 mg via INTRAVENOUS

## 2022-06-11 NOTE — Telephone Encounter (Signed)
Patient calling with questions concerning his CT for today. Please advise

## 2022-06-11 NOTE — Telephone Encounter (Signed)
Left message for the patient to call back.

## 2022-06-14 NOTE — Telephone Encounter (Signed)
Left vm to call back

## 2022-06-15 ENCOUNTER — Telehealth: Payer: Self-pay

## 2022-06-15 NOTE — Telephone Encounter (Signed)
Patient called wanting to know what the results of his coronary CT were. I explained that Dr. Agustin Cree had not interpreted his coronary CT at this time. I asked him to call back in a couple of days to get the results and interpretation of his test. Patient was agreeable with this plan and had no further questions at this time.

## 2022-06-15 NOTE — Telephone Encounter (Signed)
Left message for the patient to call back.

## 2022-06-18 ENCOUNTER — Encounter: Payer: Self-pay | Admitting: Cardiology

## 2022-06-23 ENCOUNTER — Telehealth: Payer: Self-pay

## 2022-06-23 DIAGNOSIS — E785 Hyperlipidemia, unspecified: Secondary | ICD-10-CM

## 2022-06-23 MED ORDER — ATORVASTATIN CALCIUM 40 MG PO TABS: 40.0000 mg | ORAL_TABLET | Freq: Every day | ORAL | 3 refills | Status: DC

## 2022-06-23 NOTE — Telephone Encounter (Signed)
Spoke with pt regarding labs as per Dr. Wendy Poet note. He will increase Lipitor to 40mg  daily and have fasting Lipid, AST, ALT in 6 weeks. Pt verbalized understanding and had no further questions.

## 2022-06-29 ENCOUNTER — Ambulatory Visit: Payer: BC Managed Care – PPO | Attending: Cardiology | Admitting: Cardiology

## 2022-06-29 ENCOUNTER — Encounter: Payer: Self-pay | Admitting: Cardiology

## 2022-06-29 VITALS — BP 110/70 | HR 84 | Ht 71.0 in | Wt 243.0 lb

## 2022-06-29 DIAGNOSIS — I10 Essential (primary) hypertension: Secondary | ICD-10-CM | POA: Diagnosis not present

## 2022-06-29 DIAGNOSIS — E785 Hyperlipidemia, unspecified: Secondary | ICD-10-CM

## 2022-06-29 DIAGNOSIS — R7303 Prediabetes: Secondary | ICD-10-CM

## 2022-06-29 DIAGNOSIS — I2510 Atherosclerotic heart disease of native coronary artery without angina pectoris: Secondary | ICD-10-CM | POA: Diagnosis not present

## 2022-06-29 MED ORDER — ATORVASTATIN CALCIUM 40 MG PO TABS: 40.0000 mg | ORAL_TABLET | Freq: Every day | ORAL | 3 refills | Status: DC

## 2022-06-29 NOTE — Patient Instructions (Signed)
Medication Instructions:   Lipitor 40mg  1 tablet daily   Lab Work: None Ordered If you have labs (blood work) drawn today and your tests are completely normal, you will receive your results only by: MyChart Message (if you have MyChart) OR A paper copy in the mail If you have any lab test that is abnormal or we need to change your treatment, we will call you to review the results.   Testing/Procedures: None Ordered   Follow-Up: At [facility], you and your health needs are our priority.  As part of our continuing mission to provide you with exceptional heart care, we have created designated Provider Care Teams.  These Care Teams include your primary Cardiologist (physician) and Advanced Practice Providers (APPs -  Physician Assistants and Nurse Practitioners) who all work together to provide you with the care you need, when you need it.  We recommend signing up for the patient portal called "MyChart".  Sign up information is provided on this After Visit Summary.  MyChart is used to connect with patients for Virtual Visits (Telemedicine).  Patients are able to view lab/test results, encounter notes, upcoming appointments, etc.  Non-urgent messages can be sent to your provider as well.   To learn more about what you can do with MyChart, go to [email or web address].    Your next appointment:   5 month(s)  The format for your next appointment:   In Person  Provider:   Gypsy Balsam, MD    Other Instructions NA

## 2022-06-30 NOTE — Progress Notes (Signed)
Cardiology Office Note:    Date:  06/30/2022   ID:  Gregory Hancock, DOB 09/30/1975, MRN [id number]  PCP:  Georgann Housekeeper, MD  Cardiologist:  Gypsy Balsam, MD    Referring MD: Georgann Housekeeper, MD   Chief Complaint  Patient presents with   Results  Doing fine  History of Present Illness:    Gregory Hancock is a 47 y.o. male past medical history significant for essential hypertension, prediabetes, dyslipidemia, obesity, he did have cardiac catheterization done in 2022 which showed 90% diagonal branch however felt to be too dangerous to intervene.  He is coming today to discuss results of his coronary CT angio.  First of all his calcium score in 2018 was only 22 now we have calcium score of 621 which obviously is significantly increased, we do not see any obstructive disease on the coronary CT angio.  Overall he is doing well.  He denies have any chest pain tightness squeezing pressure burning chest no palpitation dizziness swelling of lower extremities the purpose of this visit mostly is to discuss way to prevent progression of the disease.  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 No pertinent surgical history.  Current Medications: Current Meds  Medication Sig   aspirin EC 81 MG tablet Take 81 mg by mouth daily. Swallow whole.   atorvastatin (LIPITOR) 40 MG tablet Take 1 tablet (40 mg total) by mouth daily.   dapagliflozin propanediol (FARXIGA) 5 MG TABS tablet Take 5 mg by mouth daily.   metFORMIN (GLUCOPHAGE-XR) 500 MG 24 hr tablet TAKE ONE TABLET BY MOUTH TWICE DAILY   montelukast (SINGULAIR) 10 MG tablet TAKE ONE TABLET BY MOUTH ONE  TIME DAILY   omeprazole (PRILOSEC) 40 MG capsule Take 1 capsule (40 mg total) by mouth daily.   potassium chloride SA (KLOR-CON M) 20 MEQ tablet Take 1 tablet (20 mEq total) by mouth daily.   valsartan (DIOVAN) 160 MG tablet Take 160 mg by mouth daily.   [DISCONTINUED] atorvastatin (LIPITOR) 40 MG tablet Take 1 tablet (40 mg total) by mouth daily.   [DISCONTINUED] chlorthalidone (HYGROTON) 25 MG tablet Take 12.5 mg by mouth daily with breakfast.   [DISCONTINUED] metoprolol succinate (TOPROL-XL) 25 MG 24 hr tablet Take 1 tablet (25 mg total) by mouth daily.     Allergies:   Advil [ibuprofen] and Semaglutide   Social History   Socioeconomic History   Marital status: Married    Spouse name: Not on file   Number of children: Not on file   Years of education: Not on file   Highest education level: Not on file  Occupational History   Not on file  Tobacco Use   Smoking status: Never   Smokeless tobacco: Never  Substance and Sexual Activity   Alcohol use: No   Drug use: No   Sexual activity: Not on file  Other Topics Concern   Not on file  Social History Narrative   Not on file   Social Determinants of Health   Financial Resource Strain: Not on file  Food Insecurity: Not on file  Transportation Needs: Not on file  Physical Activity: Not on file  Stress: Not on file  Social Connections: Not on file     Family History: The patient's family history includes Heart attack in his maternal uncle and paternal aunt. ROS:   Please see the history of present illness.    All 14 point review of systems negative except as described per history of present illness  EKGs/Labs/Other Studies Reviewed:      Recent Labs: 03/16/2022: Magnesium 2.3 05/15/2022: BUN 15; Creatinine, Ser 0.89; Hemoglobin 15.7; Platelets 248; Potassium 3.6; Sodium 132  Recent Lipid Panel No results found for: "CHOL", "TRIG", "HDL", "CHOLHDL", "VLDL", "LDLCALC", "LDLDIRECT"  Physical Exam:    VS:  BP 110/70  (BP Location: Left Arm, Patient Position: Sitting)   Pulse 84   Ht 5\' 11"  (1.803 m)   Wt 243 lb (110.2 kg)   SpO2 98%   BMI 33.89 kg/m     Wt Readings from Last 3 Encounters:  06/29/22 243 lb (110.2 kg)  06/03/22 240 lb (108.9 kg)  03/16/22 232 lb (105.2 kg)     GEN:  Well nourished, well developed in no acute distress HEENT: Normal NECK: No JVD; No carotid bruits LYMPHATICS: No lymphadenopathy CARDIAC: RRR, no murmurs, no rubs, no gallops RESPIRATORY:  Clear to auscultation without rales, wheezing or rhonchi  ABDOMEN: Soft, non-tender, non-distended MUSCULOSKELETAL:  No edema; No deformity  SKIN: Warm and dry LOWER EXTREMITIES: no swelling NEUROLOGIC:  Alert and oriented x 3 PSYCHIATRIC:  Normal affect   ASSESSMENT:    1. Coronary artery calcification seen on CAT scan   2. Atherosclerosis of native coronary artery of native heart without angina pectoris   3. Primary hypertension   4. Dyslipidemia   5. Prediabetes    PLAN:    In order of problems listed above:  Coronary artery disease nonobstructive, will continue risk factors modifications, he is on antiplatelet therapy which I will continue, Dyslipidemia recently increased dose of his Lipitor to 40 mg daily, will make arrangements for fasting lipid profile to be done. Essential hypertension blood pressure seems to well-controlled. Prediabetes he understand he need to walk and lose some weight. We did talk in length about risk factors modifications were talked about good diet and need to exercise at least 5 times a week for 30 minutes moderate intensity exercise.   Medication Adjustments/Labs and Tests Ordered: Current medicines are reviewed at length with the patient today.  Concerns regarding medicines are outlined above.  No orders of the defined types were placed in this encounter.  Medication changes:  Meds ordered this encounter  Medications   atorvastatin (LIPITOR) 40 MG tablet    Sig: Take 1 tablet (40  mg total) by mouth daily.    Dispense:  90 tablet    Refill:  3    Signed, Georgeanna Lea, MD, [facility] 06/30/2022 8:35 AM    [facility]

## 2022-07-13 DIAGNOSIS — E785 Hyperlipidemia, unspecified: Secondary | ICD-10-CM | POA: Diagnosis not present

## 2022-07-13 DIAGNOSIS — E781 Pure hyperglyceridemia: Secondary | ICD-10-CM | POA: Diagnosis not present

## 2022-07-13 DIAGNOSIS — E119 Type 2 diabetes mellitus without complications: Secondary | ICD-10-CM | POA: Diagnosis not present

## 2022-07-13 DIAGNOSIS — I10 Essential (primary) hypertension: Secondary | ICD-10-CM | POA: Diagnosis not present

## 2022-07-20 DIAGNOSIS — E041 Nontoxic single thyroid nodule: Secondary | ICD-10-CM | POA: Diagnosis not present

## 2022-07-29 DIAGNOSIS — E041 Nontoxic single thyroid nodule: Secondary | ICD-10-CM | POA: Diagnosis not present

## 2022-08-09 DIAGNOSIS — E041 Nontoxic single thyroid nodule: Secondary | ICD-10-CM | POA: Diagnosis not present

## 2022-08-18 ENCOUNTER — Other Ambulatory Visit: Payer: Self-pay | Admitting: Internal Medicine

## 2022-08-19 DIAGNOSIS — E041 Nontoxic single thyroid nodule: Secondary | ICD-10-CM | POA: Insufficient documentation

## 2022-08-21 ENCOUNTER — Other Ambulatory Visit: Payer: Self-pay | Admitting: Internal Medicine

## 2022-08-24 ENCOUNTER — Encounter: Payer: Self-pay | Admitting: Cardiology

## 2022-08-24 DIAGNOSIS — E079 Disorder of thyroid, unspecified: Secondary | ICD-10-CM | POA: Diagnosis not present

## 2022-08-24 DIAGNOSIS — E669 Obesity, unspecified: Secondary | ICD-10-CM | POA: Diagnosis not present

## 2022-08-24 DIAGNOSIS — I10 Essential (primary) hypertension: Secondary | ICD-10-CM | POA: Diagnosis not present

## 2022-08-24 DIAGNOSIS — I2510 Atherosclerotic heart disease of native coronary artery without angina pectoris: Secondary | ICD-10-CM | POA: Diagnosis not present

## 2022-08-24 DIAGNOSIS — E785 Hyperlipidemia, unspecified: Secondary | ICD-10-CM | POA: Diagnosis not present

## 2022-08-24 DIAGNOSIS — Z01818 Encounter for other preprocedural examination: Secondary | ICD-10-CM | POA: Diagnosis not present

## 2022-08-30 DIAGNOSIS — E041 Nontoxic single thyroid nodule: Secondary | ICD-10-CM | POA: Diagnosis not present

## 2022-08-30 DIAGNOSIS — E119 Type 2 diabetes mellitus without complications: Secondary | ICD-10-CM | POA: Diagnosis not present

## 2022-08-30 DIAGNOSIS — I2510 Atherosclerotic heart disease of native coronary artery without angina pectoris: Secondary | ICD-10-CM | POA: Diagnosis not present

## 2022-08-30 DIAGNOSIS — Z7984 Long term (current) use of oral hypoglycemic drugs: Secondary | ICD-10-CM | POA: Diagnosis not present

## 2022-08-30 DIAGNOSIS — E669 Obesity, unspecified: Secondary | ICD-10-CM | POA: Diagnosis not present

## 2022-08-30 DIAGNOSIS — I10 Essential (primary) hypertension: Secondary | ICD-10-CM | POA: Diagnosis not present

## 2022-08-30 DIAGNOSIS — Z79899 Other long term (current) drug therapy: Secondary | ICD-10-CM | POA: Diagnosis not present

## 2022-08-30 DIAGNOSIS — J45909 Unspecified asthma, uncomplicated: Secondary | ICD-10-CM | POA: Diagnosis not present

## 2022-08-30 DIAGNOSIS — G4733 Obstructive sleep apnea (adult) (pediatric): Secondary | ICD-10-CM | POA: Diagnosis not present

## 2022-08-30 DIAGNOSIS — E065 Other chronic thyroiditis: Secondary | ICD-10-CM | POA: Diagnosis not present

## 2022-08-30 DIAGNOSIS — E785 Hyperlipidemia, unspecified: Secondary | ICD-10-CM | POA: Diagnosis not present

## 2022-08-30 DIAGNOSIS — K219 Gastro-esophageal reflux disease without esophagitis: Secondary | ICD-10-CM | POA: Diagnosis not present

## 2022-08-30 DIAGNOSIS — Z6834 Body mass index (BMI) 34.0-34.9, adult: Secondary | ICD-10-CM | POA: Diagnosis not present

## 2022-09-13 DIAGNOSIS — E041 Nontoxic single thyroid nodule: Secondary | ICD-10-CM | POA: Diagnosis not present

## 2022-09-13 DIAGNOSIS — E890 Postprocedural hypothyroidism: Secondary | ICD-10-CM | POA: Diagnosis not present

## 2022-10-11 DIAGNOSIS — Z1159 Encounter for screening for other viral diseases: Secondary | ICD-10-CM | POA: Diagnosis not present

## 2022-10-11 DIAGNOSIS — E785 Hyperlipidemia, unspecified: Secondary | ICD-10-CM | POA: Diagnosis not present

## 2022-10-11 DIAGNOSIS — I10 Essential (primary) hypertension: Secondary | ICD-10-CM | POA: Diagnosis not present

## 2022-10-12 DIAGNOSIS — E781 Pure hyperglyceridemia: Secondary | ICD-10-CM | POA: Diagnosis not present

## 2022-10-12 DIAGNOSIS — E119 Type 2 diabetes mellitus without complications: Secondary | ICD-10-CM | POA: Diagnosis not present

## 2022-10-12 DIAGNOSIS — I10 Essential (primary) hypertension: Secondary | ICD-10-CM | POA: Diagnosis not present

## 2022-10-12 DIAGNOSIS — E785 Hyperlipidemia, unspecified: Secondary | ICD-10-CM | POA: Diagnosis not present

## 2022-10-15 ENCOUNTER — Telehealth: Payer: Self-pay

## 2022-10-15 NOTE — Telephone Encounter (Signed)
   Primary Cardiologist: Gypsy Balsam, MD  Chart reviewed as part of pre-operative protocol coverage. Given past medical history and time since last visit, based on ACC/AHA guidelines, Gregory Hancock would be at acceptable risk for the planned procedure without further cardiovascular testing. He is able to achieve > 4 METS activity without concerning cardiac symptoms.   Patient was advised that if he develops new symptoms prior to surgery to contact our office to arrange a follow-up appointment.  He verbalized understanding.  Per office protocol, he may hold aspirin for 7 days prior to procedure and should resume as soon as hemodynamically stable postoperatively.  I will route this recommendation to the requesting party via Epic fax function and remove from pre-op pool.  Please call with questions.  Levi Aland, NP-C  10/15/2022, 3:46 PM [address] Office [phone] Fax [phone]

## 2022-10-15 NOTE — Telephone Encounter (Signed)
   Pre-operative Risk Assessment    Patient Name: Gregory Hancock  DOB: [birth year] MRN: [id number]      Request for Surgical Clearance    Procedure:   colonscopy  Date of Surgery:  Clearance 10/25/22                                 Surgeon:  Dr. Phillips Climes Group or Practice Name:  [facility] Phone number:  [phone] Fax number:  [phone]   Type of Clearance Requested:   - Medical    Type of Anesthesia:   propofol   Additional requests/questions:    Signed, Dione Housekeeper   10/15/2022, 2:28 PM

## 2022-11-18 DIAGNOSIS — I10 Essential (primary) hypertension: Secondary | ICD-10-CM | POA: Diagnosis not present

## 2022-11-18 DIAGNOSIS — E119 Type 2 diabetes mellitus without complications: Secondary | ICD-10-CM | POA: Diagnosis not present

## 2022-11-18 DIAGNOSIS — K219 Gastro-esophageal reflux disease without esophagitis: Secondary | ICD-10-CM | POA: Diagnosis not present

## 2022-11-20 ENCOUNTER — Other Ambulatory Visit: Payer: Self-pay | Admitting: Internal Medicine

## 2022-11-30 ENCOUNTER — Encounter: Payer: Self-pay | Admitting: Cardiology

## 2022-11-30 ENCOUNTER — Ambulatory Visit: Payer: BC Managed Care – PPO | Attending: Cardiology | Admitting: Cardiology

## 2022-11-30 VITALS — BP 126/74 | HR 86 | Ht 71.0 in | Wt 245.0 lb

## 2022-11-30 DIAGNOSIS — E119 Type 2 diabetes mellitus without complications: Secondary | ICD-10-CM

## 2022-11-30 DIAGNOSIS — I2510 Atherosclerotic heart disease of native coronary artery without angina pectoris: Secondary | ICD-10-CM

## 2022-11-30 DIAGNOSIS — E785 Hyperlipidemia, unspecified: Secondary | ICD-10-CM | POA: Diagnosis not present

## 2022-11-30 DIAGNOSIS — Z7984 Long term (current) use of oral hypoglycemic drugs: Secondary | ICD-10-CM

## 2022-11-30 DIAGNOSIS — E669 Obesity, unspecified: Secondary | ICD-10-CM

## 2022-11-30 NOTE — Patient Instructions (Addendum)
Medication Instructions:  Your physician recommends that you continue on your current medications as directed. Please refer to the Current Medication list given to you today.  *If you need a refill on your cardiac medications before your next appointment, please call your pharmacy*   Lab Work: 3rd Floor   [address]  Your physician recommends that you return for lab work in:  3 months  You need to have labs done when you are fasting.  You can come Monday through Friday 8:00 am to 11:30AM and 1:00 to 4:00. You do not need to make an appointment as the order has already been placed.      Testing/Procedures: None Ordered   Follow-Up: At [facility], you and your health needs are our priority.  As part of our continuing mission to provide you with exceptional heart care, we have created designated Provider Care Teams.  These Care Teams include your primary Cardiologist (physician) and Advanced Practice Providers (APPs -  Physician Assistants and Nurse Practitioners) who all work together to provide you with the care you need, when you need it.  We recommend signing up for the patient portal called "MyChart".  Sign up information is provided on this After Visit Summary.  MyChart is used to connect with patients for Virtual Visits (Telemedicine).  Patients are able to view lab/test results, encounter notes, upcoming appointments, etc.  Non-urgent messages can be sent to your provider as well.   To learn more about what you can do with MyChart, go to [email or web address].    Your next appointment:   6 month(s)  The format for your next appointment:   In Person  Provider:   Gypsy Balsam, MD    Other Instructions NA

## 2022-11-30 NOTE — Progress Notes (Signed)
Cardiology Office Note:    Date:  11/30/2022   ID:  Gregory Hancock, DOB [birth year], MRN [id number]  PCP:  Georgann Housekeeper, MD  Cardiologist:  Gypsy Balsam, MD    Referring MD: Georgann Housekeeper, MD   Chief Complaint  Patient presents with   Follow-up    History of Present Illness:    Gregory Hancock is a 47 y.o. male  past medical history significant for essential hypertension, prediabetes, dyslipidemia, obesity, he did have cardiac catheterization done in 2022 which showed 90% diagonal branch however felt to be too dangerous to intervene. He is coming today to discuss results of his coronary CT angio. First of all his calcium score in 2018 was only 22 now we have calcium score of 621 which obviously is significantly increased, we do not see any obstructive disease on the coronary CT angio.  Overall doing well.  Denies have any chest pain tightness squeezing pressure burning chest.  The biggest issue I see right now is diabetes which is poorly controlled he understands he is trying to work hard to improve it.  He does have CGM he is trying to BL be more active he is watching more carefully what he eats.  Past Medical History:  Diagnosis Date   Allergic rhinitis 02/09/2017   Elevated triglycerides with high cholesterol 02/09/2017   Hypertension    Obesity (BMI 30-39.9) 02/09/2017   Prediabetes 02/09/2017   RAD (reactive airway disease)    Trichilemmal cyst 02/09/2017   Type 2 diabetes mellitus without complication, without long-term current use of insulin (HCC) 04/13/2022    History reviewed. No pertinent surgical history.  Current Medications: Current Meds  Medication Sig   aspirin EC 81 MG tablet Take 81 mg by mouth daily. Swallow whole.   atorvastatin (LIPITOR) 40 MG tablet Take 1 tablet (40 mg total) by mouth daily.   dapagliflozin propanediol (FARXIGA) 5 MG TABS tablet Take 5 mg by mouth daily.   metFORMIN (GLUCOPHAGE-XR) 500 MG 24 hr tablet TAKE ONE TABLET BY MOUTH TWICE  DAILY   montelukast (SINGULAIR) 10 MG tablet TAKE ONE TABLET BY MOUTH ONCE A DAY   omeprazole (PRILOSEC) 40 MG capsule Take 1 capsule (40 mg total) by mouth daily.   potassium chloride SA (KLOR-CON M) 20 MEQ tablet Take 1 tablet (20 mEq total) by mouth daily.   valsartan (DIOVAN) 160 MG tablet Take 160 mg by mouth daily.     Allergies:   Advil [ibuprofen] and Semaglutide   Social History   Socioeconomic History   Marital status: Married    Spouse name: Not on file   Number of children: Not on file   Years of education: Not on file   Highest education level: Not on file  Occupational History   Not on file  Tobacco Use   Smoking status: Never   Smokeless tobacco: Never  Substance and Sexual Activity   Alcohol use: No   Drug use: No   Sexual activity: Not on file  Other Topics Concern   Not on file  Social History Narrative   Not on file   Social Determinants of Health   Financial Resource Strain: Not on file  Food Insecurity: Low Risk  (10/05/2022)   Received from [facility]   Hunger Vital Sign    Worried About Running Out of Food in the Last Year: Never true    Ran Out of Food in the Last Year: Never true  Transportation Needs: Not on file (10/05/2022)  Physical Activity: Not  on file  Stress: Not on file  Social Connections: Unknown (08/04/2021)   Received from [facility]   Social Network    Social Network: Not on file     Family History: The patient's family history includes Heart attack in his maternal uncle and paternal aunt. ROS:   Please see the history of present illness.    All 14 point review of systems negative except as described per history of present illness  EKGs/Labs/Other Studies Reviewed:         Recent Labs: 03/16/2022: Magnesium 2.3 05/15/2022: BUN 15; Creatinine, Ser 0.89; Hemoglobin 15.7; Platelets 248; Potassium 3.6; Sodium 132  Recent Lipid Panel No results found for: "CHOL", "TRIG", "HDL", "CHOLHDL", "VLDL",  "LDLCALC", "LDLDIRECT"  Physical Exam:    VS:  BP 126/74 (BP Location: Left Arm, Patient Position: Sitting)   Pulse 86   Ht 5\' 11"  (1.803 m)   Wt 245 lb (111.1 kg)   SpO2 100%   BMI 34.17 kg/m     Wt Readings from Last 3 Encounters:  11/30/22 245 lb (111.1 kg)  06/29/22 243 lb (110.2 kg)  06/03/22 240 lb (108.9 kg)     GEN:  Well nourished, well developed in no acute distress HEENT: Normal NECK: No JVD; No carotid bruits LYMPHATICS: No lymphadenopathy CARDIAC: RRR, no murmurs, no rubs, no gallops RESPIRATORY:  Clear to auscultation without rales, wheezing or rhonchi  ABDOMEN: Soft, non-tender, non-distended MUSCULOSKELETAL:  No edema; No deformity  SKIN: Warm and dry LOWER EXTREMITIES: no swelling NEUROLOGIC:  Alert and oriented x 3 PSYCHIATRIC:  Normal affect   ASSESSMENT:    1. Dyslipidemia   2. Atherosclerosis of native coronary artery of native heart without angina pectoris   3. Type 2 diabetes mellitus without complication, without long-term current use of insulin (HCC)   4. Obesity (BMI 30-39.9)    PLAN:    In order of problems listed above:  Coronary disease likely asymptomatic, will continue antiplatelet therapy and risk reductions. Type 2 diabetes worrying me a lot.  He understand this and he is working with his primary care physician trying all different medications.  Now he gets CGM and sugars much better controlled. Dyslipidemia I did review her last blood test done he is and LDL was elevated however he is being more active he changed his diet his diabetes is better within next few months we will repeat his fasting lipid profile and then decide if we need to augment his therapy.   Medication Adjustments/Labs and Tests Ordered: Current medicines are reviewed at length with the patient today.  Concerns regarding medicines are outlined above.  Orders Placed This Encounter  Procedures   ALT   AST   Lipid panel   Medication changes: No orders of the  defined types were placed in this encounter.   Signed, Georgeanna Lea, MD, [facility] 11/30/2022 4:59 PM    [facility]

## 2022-12-02 DIAGNOSIS — E119 Type 2 diabetes mellitus without complications: Secondary | ICD-10-CM | POA: Diagnosis not present

## 2022-12-02 DIAGNOSIS — H35713 Central serous chorioretinopathy, bilateral: Secondary | ICD-10-CM | POA: Diagnosis not present

## 2022-12-02 DIAGNOSIS — H524 Presbyopia: Secondary | ICD-10-CM | POA: Diagnosis not present

## 2022-12-02 DIAGNOSIS — H2513 Age-related nuclear cataract, bilateral: Secondary | ICD-10-CM | POA: Diagnosis not present

## 2022-12-06 ENCOUNTER — Other Ambulatory Visit: Payer: Self-pay | Admitting: Internal Medicine

## 2022-12-15 DIAGNOSIS — D123 Benign neoplasm of transverse colon: Secondary | ICD-10-CM | POA: Diagnosis not present

## 2022-12-15 DIAGNOSIS — Z1211 Encounter for screening for malignant neoplasm of colon: Secondary | ICD-10-CM | POA: Diagnosis not present

## 2022-12-15 DIAGNOSIS — K648 Other hemorrhoids: Secondary | ICD-10-CM | POA: Diagnosis not present

## 2022-12-15 DIAGNOSIS — D124 Benign neoplasm of descending colon: Secondary | ICD-10-CM | POA: Diagnosis not present

## 2022-12-15 DIAGNOSIS — K635 Polyp of colon: Secondary | ICD-10-CM | POA: Diagnosis not present

## 2022-12-15 DIAGNOSIS — D125 Benign neoplasm of sigmoid colon: Secondary | ICD-10-CM | POA: Diagnosis not present

## 2022-12-17 DIAGNOSIS — L739 Follicular disorder, unspecified: Secondary | ICD-10-CM | POA: Diagnosis not present

## 2022-12-17 DIAGNOSIS — E119 Type 2 diabetes mellitus without complications: Secondary | ICD-10-CM | POA: Diagnosis not present

## 2022-12-17 DIAGNOSIS — M549 Dorsalgia, unspecified: Secondary | ICD-10-CM | POA: Diagnosis not present

## 2022-12-17 DIAGNOSIS — R109 Unspecified abdominal pain: Secondary | ICD-10-CM | POA: Diagnosis not present

## 2022-12-17 DIAGNOSIS — R10819 Abdominal tenderness, unspecified site: Secondary | ICD-10-CM | POA: Diagnosis not present

## 2022-12-31 DIAGNOSIS — N1330 Unspecified hydronephrosis: Secondary | ICD-10-CM | POA: Diagnosis not present

## 2022-12-31 DIAGNOSIS — R109 Unspecified abdominal pain: Secondary | ICD-10-CM | POA: Diagnosis not present

## 2023-01-25 DIAGNOSIS — E669 Obesity, unspecified: Secondary | ICD-10-CM | POA: Diagnosis not present

## 2023-01-25 DIAGNOSIS — I10 Essential (primary) hypertension: Secondary | ICD-10-CM | POA: Diagnosis not present

## 2023-01-25 DIAGNOSIS — E119 Type 2 diabetes mellitus without complications: Secondary | ICD-10-CM | POA: Diagnosis not present

## 2023-01-25 DIAGNOSIS — H6123 Impacted cerumen, bilateral: Secondary | ICD-10-CM | POA: Diagnosis not present

## 2023-02-03 ENCOUNTER — Other Ambulatory Visit: Payer: Self-pay | Admitting: Internal Medicine

## 2023-02-04 DIAGNOSIS — G4733 Obstructive sleep apnea (adult) (pediatric): Secondary | ICD-10-CM | POA: Diagnosis not present

## 2023-02-10 DIAGNOSIS — Z6834 Body mass index (BMI) 34.0-34.9, adult: Secondary | ICD-10-CM | POA: Diagnosis not present

## 2023-02-10 DIAGNOSIS — Z79899 Other long term (current) drug therapy: Secondary | ICD-10-CM | POA: Diagnosis not present

## 2023-02-10 DIAGNOSIS — G4733 Obstructive sleep apnea (adult) (pediatric): Secondary | ICD-10-CM | POA: Diagnosis not present

## 2023-02-10 DIAGNOSIS — Z9189 Other specified personal risk factors, not elsewhere classified: Secondary | ICD-10-CM | POA: Diagnosis not present

## 2023-02-10 DIAGNOSIS — K219 Gastro-esophageal reflux disease without esophagitis: Secondary | ICD-10-CM | POA: Diagnosis not present

## 2023-02-10 DIAGNOSIS — I10 Essential (primary) hypertension: Secondary | ICD-10-CM | POA: Diagnosis not present

## 2023-02-10 DIAGNOSIS — E1169 Type 2 diabetes mellitus with other specified complication: Secondary | ICD-10-CM | POA: Diagnosis not present

## 2023-02-10 DIAGNOSIS — E782 Mixed hyperlipidemia: Secondary | ICD-10-CM | POA: Diagnosis not present

## 2023-02-10 DIAGNOSIS — E6609 Other obesity due to excess calories: Secondary | ICD-10-CM | POA: Diagnosis not present

## 2023-02-15 ENCOUNTER — Other Ambulatory Visit: Payer: Self-pay

## 2023-02-23 DIAGNOSIS — H9313 Tinnitus, bilateral: Secondary | ICD-10-CM | POA: Diagnosis not present

## 2023-02-23 DIAGNOSIS — H903 Sensorineural hearing loss, bilateral: Secondary | ICD-10-CM | POA: Diagnosis not present

## 2023-03-02 DIAGNOSIS — R946 Abnormal results of thyroid function studies: Secondary | ICD-10-CM | POA: Diagnosis not present

## 2023-03-02 DIAGNOSIS — G4733 Obstructive sleep apnea (adult) (pediatric): Secondary | ICD-10-CM | POA: Diagnosis not present

## 2023-03-02 DIAGNOSIS — R7989 Other specified abnormal findings of blood chemistry: Secondary | ICD-10-CM | POA: Diagnosis not present

## 2023-03-02 DIAGNOSIS — E1169 Type 2 diabetes mellitus with other specified complication: Secondary | ICD-10-CM | POA: Diagnosis not present

## 2023-03-02 DIAGNOSIS — I10 Essential (primary) hypertension: Secondary | ICD-10-CM | POA: Diagnosis not present

## 2023-03-02 DIAGNOSIS — E782 Mixed hyperlipidemia: Secondary | ICD-10-CM | POA: Diagnosis not present

## 2023-03-03 DIAGNOSIS — E785 Hyperlipidemia, unspecified: Secondary | ICD-10-CM | POA: Diagnosis not present

## 2023-03-04 ENCOUNTER — Other Ambulatory Visit: Payer: Self-pay | Admitting: Internal Medicine

## 2023-03-04 LAB — LIPID PANEL
Chol/HDL Ratio: 4.4 {ratio} (ref 0.0–5.0)
Cholesterol, Total: 123 mg/dL (ref 100–199)
HDL: 28 mg/dL — ABNORMAL LOW (ref >39)
LDL Chol Calc (NIH): 63 mg/dL (ref 0–99)
Triglycerides: 194 mg/dL — ABNORMAL HIGH (ref 0–149)
VLDL Cholesterol Cal: 32 mg/dL (ref 5–40)

## 2023-03-04 LAB — AST: AST: 19 [IU]/L (ref 0–40)

## 2023-03-04 LAB — ALT: ALT: 23 [IU]/L (ref 0–44)

## 2023-03-08 ENCOUNTER — Encounter: Payer: Self-pay | Admitting: Cardiology

## 2023-04-30 ENCOUNTER — Other Ambulatory Visit: Payer: Self-pay | Admitting: Internal Medicine

## 2023-07-27 ENCOUNTER — Other Ambulatory Visit: Payer: Self-pay | Admitting: Cardiology

## 2023-09-01 ENCOUNTER — Encounter: Payer: Self-pay | Admitting: Cardiology

## 2023-09-01 ENCOUNTER — Ambulatory Visit: Attending: Cardiology | Admitting: Cardiology

## 2023-09-01 VITALS — BP 134/76 | HR 89 | Ht 71.0 in | Wt 248.0 lb

## 2023-09-01 DIAGNOSIS — E119 Type 2 diabetes mellitus without complications: Secondary | ICD-10-CM | POA: Diagnosis not present

## 2023-09-01 DIAGNOSIS — E785 Hyperlipidemia, unspecified: Secondary | ICD-10-CM | POA: Diagnosis not present

## 2023-09-01 DIAGNOSIS — I10 Essential (primary) hypertension: Secondary | ICD-10-CM | POA: Diagnosis not present

## 2023-09-01 DIAGNOSIS — I2510 Atherosclerotic heart disease of native coronary artery without angina pectoris: Secondary | ICD-10-CM | POA: Diagnosis not present

## 2023-09-01 NOTE — Patient Instructions (Signed)
 Medication Instructions:  Your physician has requested that you have cardiac CT. Cardiac computed tomography (CT) is a painless test that uses an x-ray machine to take clear, detailed pictures of your heart. For further information please visit [email or web address] Please follow instruction sheet as given.   *If you need a refill on your cardiac medications before your next appointment, please call your pharmacy*  Lab Work: None If you have labs (blood work) drawn today and your tests are completely normal, you will receive your results only by: MyChart Message (if you have MyChart) OR A paper copy in the mail If you have any lab test that is abnormal or we need to change your treatment, we will call you to review the results.  Testing/Procedures: None  Follow-Up: At [facility], you and your health needs are our priority.  As part of our continuing mission to provide you with exceptional heart care, our providers are all part of one team.  This team includes your primary Cardiologist (physician) and Advanced Practice Providers or APPs (Physician Assistants and Nurse Practitioners) who all work together to provide you with the care you need, when you need it.  Your next appointment:   6 month(s)  Provider:   Ralene Burger, MD    We recommend signing up for the patient portal called MyChart.  Sign up information is provided on this After Visit Summary.  MyChart is used to connect with patients for Virtual Visits (Telemedicine).  Patients are able to view lab/test results, encounter notes, upcoming appointments, etc.  Non-urgent messages can be sent to your provider as well.   To learn more about what you can do with MyChart, go to [email or web address].   Other Instructions

## 2023-09-01 NOTE — Progress Notes (Signed)
 Cardiology Office Note:    Date:  09/01/2023   ID:  Gregory Hancock, DOB [birth year], MRN [id number]  PCP:  Jearldine Mina, MD  Cardiologist:  Ralene Burger, MD    Referring MD: Jearldine Mina, MD   Chief Complaint  Patient presents with   Follow-up    History of Present Illness:    Gregory Hancock is a 48 y.o. male   past medical history significant for essential hypertension, prediabetes, dyslipidemia, obesity, he did have cardiac catheterization done in 2022 which showed 90% diagonal branch however felt to be too dangerous to intervene. He is coming today to discuss results of his coronary CT angio. First of all his calcium  score in 2018 was only 22 now we have calcium  score of 621 which obviously is significantly increased, we do not see any obstructive disease on the coronary CT angio. . Comes today 2 months for follow-up overall doing great.  He denies have any chest pain tightness squeezing pressure burning chest.  Walks few times a week for half an hour to 45 minutes with no symptoms overall doing very well.  No chest pain tightness squeezing pressure burning chest  Past Medical History:  Diagnosis Date   Allergic rhinitis 02/09/2017   Elevated triglycerides with high cholesterol 02/09/2017   Hypertension    Obesity (BMI 30-39.9) 02/09/2017   Prediabetes 02/09/2017   RAD (reactive airway disease)    Trichilemmal cyst 02/09/2017   Type 2 diabetes mellitus without complication, without long-term current use of insulin  (HCC) 04/13/2022    History reviewed. No pertinent surgical history.  Current Medications: Current Meds  Medication Sig   aspirin  EC 81 MG tablet Take 81 mg by mouth daily. Swallow whole.   atorvastatin  (LIPITOR) 40 MG tablet TAKE ONE TABLET BY MOUTH ONCE A DAY   dapagliflozin propanediol (FARXIGA) 5 MG TABS tablet Take 5 mg by mouth daily.   levothyroxine (SYNTHROID) 50 MCG tablet Take 50 mcg by mouth daily.   metFORMIN  (GLUCOPHAGE -XR) 500 MG 24 hr tablet  TAKE ONE TABLET BY MOUTH TWICE DAILY   montelukast  (SINGULAIR ) 10 MG tablet TAKE ONE TABLET BY MOUTH ONCE A DAY   omeprazole  (PRILOSEC) 40 MG capsule Take 1 capsule (40 mg total) by mouth daily.   OZEMPIC, 1 MG/DOSE, 4 MG/3ML SOPN Inject 1 mg into the skin once a week.   potassium chloride  SA (KLOR-CON  M) 20 MEQ tablet Take 1 tablet (20 mEq total) by mouth daily.   valsartan (DIOVAN) 160 MG tablet Take 160 mg by mouth daily.     Allergies:   Advil [ibuprofen] and Semaglutide   Social History   Socioeconomic History   Marital status: Married    Spouse name: Not on file   Number of children: Not on file   Years of education: Not on file   Highest education level: Not on file  Occupational History   Not on file  Tobacco Use   Smoking status: Never   Smokeless tobacco: Never  Substance and Sexual Activity   Alcohol use: No   Drug use: No   Sexual activity: Not on file  Other Topics Concern   Not on file  Social History Narrative   Not on file   Social Drivers of Health   Financial Resource Strain: Not on file  Food Insecurity: Low Risk  (10/05/2022)   Received from [facility]   Hunger Vital Sign    Within the past 12 months, you worried that your food would run out before you got  money to buy more: Never true    Within the past 12 months, the food you bought just didn't last and you didn't have money to get more. : Never true  Transportation Needs: Not on file (10/05/2022)  Physical Activity: Not on file  Stress: Not on file  Social Connections: Unknown (08/04/2021)   Received from [facility]   Social Network    Social Network: Not on file     Family History: The patient's family history includes Heart attack in his maternal uncle and paternal aunt. ROS:   Please see the history of present illness.    All 14 point review of systems negative except as described per history of present illness  EKGs/Labs/Other Studies Reviewed:    EKG  Interpretation Date/Time:  Thursday September 01 2023 16:27:27 EDT Ventricular Rate:  90 PR Interval:  150 QRS Duration:  82 QT Interval:  356 QTC Calculation: 435 R Axis:   36  Text Interpretation: Normal sinus rhythm Septal infarct , age undetermined When compared with ECG of 15-May-2022 21:43, PREVIOUS ECG IS PRESENT Confirmed by Ralene Burger [phone]) on 09/01/2023 4:48:04 PM    Recent Labs: 03/03/2023: ALT 23  Recent Lipid Panel    Component Value Date/Time   CHOL 123 03/03/2023 1607   TRIG 194 (H) 03/03/2023 1607   HDL 28 (L) 03/03/2023 1607   CHOLHDL 4.4 03/03/2023 1607   LDLCALC 63 03/03/2023 1607    Physical Exam:    VS:  BP 134/76 (BP Location: Right Arm, Patient Position: Sitting)   Pulse 89   Ht 5' 11 (1.803 m)   Wt 248 lb (112.5 kg)   SpO2 97%   BMI 34.59 kg/m     Wt Readings from Last 3 Encounters:  09/01/23 248 lb (112.5 kg)  11/30/22 245 lb (111.1 kg)  06/29/22 243 lb (110.2 kg)     GEN:  Well nourished, well developed in no acute distress HEENT: Normal NECK: No JVD; No carotid bruits LYMPHATICS: No lymphadenopathy CARDIAC: RRR, no murmurs, no rubs, no gallops RESPIRATORY:  Clear to auscultation without rales, wheezing or rhonchi  ABDOMEN: Soft, non-tender, non-distended MUSCULOSKELETAL:  No edema; No deformity  SKIN: Warm and dry LOWER EXTREMITIES: no swelling NEUROLOGIC:  Alert and oriented x 3 PSYCHIATRIC:  Normal affect   ASSESSMENT:    1. Primary hypertension   2. Atherosclerosis of native coronary artery of native heart without angina pectoris   3. Type 2 diabetes mellitus without complication, without long-term current use of insulin  (HCC)   4. Dyslipidemia    PLAN:    In order of problems listed above:  Coronary artery disease stable from that point review on guideline directed medical therapy asymptomatic we will continue present management. Dyslipidemia I did review K PN which show me his LDL of 63 HDL 28.  Good cholesterol  management continue present management which include high intensity statin for of Lipitor 40. Diabetes last hemoglobin A1c from summer of last year is 7.5 he said it is better right now we will continue monitoring. We did talk about need to exercise on the regular basis which she already does. Essential hypertension blood pressure well-controlled   Medication Adjustments/Labs and Tests Ordered: Current medicines are reviewed at length with the patient today.  Concerns regarding medicines are outlined above.  Orders Placed This Encounter  Procedures   EKG 12-Lead   Medication changes: No orders of the defined types were placed in this encounter.   Signed, Manfred Seed, MD, [facility]  09/01/2023 5:20 PM    [facility]

## 2023-10-08 ENCOUNTER — Other Ambulatory Visit: Payer: Self-pay | Admitting: Cardiology

## 2024-02-02 ENCOUNTER — Encounter: Payer: Self-pay | Admitting: Cardiology

## 2024-02-02 MED ORDER — METOPROLOL SUCCINATE ER 25 MG PO TB24: 25.0000 mg | ORAL_TABLET | Freq: Two times a day (BID) | ORAL | 3 refills | Status: AC

## 2024-02-28 ENCOUNTER — Encounter: Payer: Self-pay | Admitting: Cardiology

## 2024-02-28 ENCOUNTER — Ambulatory Visit: Attending: Cardiology | Admitting: Cardiology

## 2024-02-28 VITALS — BP 120/90 | HR 90 | Ht 71.0 in | Wt 246.0 lb

## 2024-02-28 DIAGNOSIS — E119 Type 2 diabetes mellitus without complications: Secondary | ICD-10-CM

## 2024-02-28 DIAGNOSIS — I2510 Atherosclerotic heart disease of native coronary artery without angina pectoris: Secondary | ICD-10-CM

## 2024-02-28 DIAGNOSIS — I10 Essential (primary) hypertension: Secondary | ICD-10-CM

## 2024-02-28 DIAGNOSIS — K5900 Constipation, unspecified: Secondary | ICD-10-CM | POA: Insufficient documentation

## 2024-02-28 DIAGNOSIS — R1032 Left lower quadrant pain: Secondary | ICD-10-CM | POA: Insufficient documentation

## 2024-02-28 DIAGNOSIS — E785 Hyperlipidemia, unspecified: Secondary | ICD-10-CM

## 2024-02-28 DIAGNOSIS — Z860100 Personal history of colon polyps, unspecified: Secondary | ICD-10-CM | POA: Insufficient documentation

## 2024-02-28 MED ORDER — ATORVASTATIN CALCIUM 80 MG PO TABS: 80.0000 mg | ORAL_TABLET | Freq: Every day | ORAL | 3 refills | Status: AC

## 2024-02-28 NOTE — Progress Notes (Signed)
 Cardiology Office Note:    Date:  02/28/2024   ID:  Gregory Hancock, DOB [birth year], MRN [id number]  PCP:  Ransom Other, MD  Cardiologist:  Gregory Fitch, MD    Referring MD: Ransom Other, MD   No chief complaint on file.   History of Present Illness:    Gregory Hancock is a 48 y.o. male    past medical history significant for essential hypertension, prediabetes, dyslipidemia, obesity, he did have cardiac catheterization done in 2022 which showed 90% diagonal branch however felt to be too dangerous to intervene. He is coming today to discuss results of his coronary CT angio. First of all his calcium  score in 2018 was only 22 now we have calcium  score of 621 which obviously is significantly increased, we do not see any obstructive disease on the coronary CT angio. .  I was today to my office for follow-up, cardiac wise doing well.  He denies have any chest pain tightness squeezing pressure burning chest.  He tries to be active walks between [id number]'s a day steps  Past Medical History:  Diagnosis Date   Allergic rhinitis 02/09/2017   Elevated triglycerides with high cholesterol 02/09/2017   Hypertension    Obesity (BMI 30-39.9) 02/09/2017   Prediabetes 02/09/2017   RAD (reactive airway disease)    Trichilemmal cyst 02/09/2017   Type 2 diabetes mellitus without complication, without long-term current use of insulin  (HCC) 04/13/2022    History reviewed. No pertinent surgical history.  Current Medications: Current Meds  Medication Sig   atorvastatin  (LIPITOR) 80 MG tablet Take 1 tablet (80 mg total) by mouth daily.   clindamycin (CLEOCIN T) 1 % lotion Apply 1 Application topically daily.   gabapentin (NEURONTIN) 300 MG capsule Take 300 mg by mouth at bedtime.   omega-3 acid ethyl esters (LOVAZA) 1 g capsule Take 1 g by mouth daily.   OZEMPIC, 0.25 OR 0.5 MG/DOSE, 2 MG/3ML SOPN Inject 0.25 mg into the skin every 7 (seven) days.     Allergies:   Advil [ibuprofen] and Semaglutide    Social History   Socioeconomic History   Marital status: Married    Spouse name: Not on file   Number of children: Not on file   Years of education: Not on file   Highest education level: Not on file  Occupational History   Not on file  Tobacco Use   Smoking status: Never   Smokeless tobacco: Never  Substance and Sexual Activity   Alcohol use: No   Drug use: No   Sexual activity: Not on file  Other Topics Concern   Not on file  Social History Narrative   Not on file   Social Drivers of Health   Financial Resource Strain: Not on file  Food Insecurity: Low Risk (10/05/2022)   Received from [facility]   Hunger Vital Sign    Within the past 12 months, you worried that your food would run out before you got money to buy more: Never true    Within the past 12 months, the food you bought just didn't last and you didn't have money to get more. : Never true  Transportation Needs: No Transportation Needs (10/05/2022)   Received from [facility]    In the past 12 months, has lack of reliable transportation kept you from medical appointments, meetings, work or from getting things needed for daily living? : No  Physical Activity: Not on file  Stress: Not on file  Social Connections: Not  on file     Family History: The patient's family history includes Heart attack in his maternal uncle and paternal aunt. ROS:   Please see the history of present illness.    All 14 point review of systems negative except as described per history of present illness  EKGs/Labs/Other Studies Reviewed:         Recent Labs: 03/03/2023: ALT 23  Recent Lipid Panel    Component Value Date/Time   CHOL 123 03/03/2023 1607   TRIG 194 (H) 03/03/2023 1607   HDL 28 (L) 03/03/2023 1607   CHOLHDL 4.4 03/03/2023 1607   LDLCALC 63 03/03/2023 1607    Physical Exam:    VS:  BP (!) 120/90   Pulse 90   Ht 5' 11 (1.803 m)   Wt 246 lb (111.6 kg)   SpO2 97%   BMI 34.31 kg/m      Wt Readings from Last 3 Encounters:  02/28/24 246 lb (111.6 kg)  09/01/23 248 lb (112.5 kg)  11/30/22 245 lb (111.1 kg)     GEN:  Well nourished, well developed in no acute distress HEENT: Normal NECK: No JVD; No carotid bruits LYMPHATICS: No lymphadenopathy CARDIAC: RRR, no murmurs, no rubs, no gallops RESPIRATORY:  Clear to auscultation without rales, wheezing or rhonchi  ABDOMEN: Soft, non-tender, non-distended MUSCULOSKELETAL:  No edema; No deformity  SKIN: Warm and dry LOWER EXTREMITIES: no swelling NEUROLOGIC:  Alert and oriented x 3 PSYCHIATRIC:  Normal affect   ASSESSMENT:    1. Dyslipidemia   2. Atherosclerosis of native coronary artery of native heart without angina pectoris   3. Primary hypertension   4. Type 2 diabetes mellitus without complication, without long-term current use of insulin  (HCC)   5. Morbid obesity (HCC)    PLAN:    In order of problems listed above:  Coronary disease stable from that point review denies of any symptoms that would indicate reactivation of the problem.  Will continue present management. Dyslipidemia I did review his blood work LDL 73 HDL 32.  Will double his statin, fasting lipid profile is TLT will be done within the next few weeks.   Diabetes mellitus his last hemoglobin A1c 6.3 and congratulated him for it is a good number for him.  Encouraged him to be little bit more active and will increase his statins   Medication Adjustments/Labs and Tests Ordered: Current medicines are reviewed at length with the patient today.  Concerns regarding medicines are outlined above.  Orders Placed This Encounter  Procedures   ALT   AST   Lipid panel   Medication changes:  Meds ordered this encounter  Medications   atorvastatin  (LIPITOR) 80 MG tablet    Sig: Take 1 tablet (80 mg total) by mouth daily.    Dispense:  90 tablet    Refill:  3    Signed, Gregory DOROTHA Fitch, MD, [facility] 02/28/2024 4:51 PM    [facility]

## 2024-02-28 NOTE — Patient Instructions (Signed)
 Medication Instructions:   INCREASE: Lipitor to 80mg  daily   Lab Work: 3rd Floor   [address]  Your physician recommends that you return for lab work in:  6 weeks  You need to have labs done when you are fasting.  You can come Monday through Friday 8:00 am to 11:30AM and 1:00 to 4:00. You do not need to make an appointment as the order has already been placed.    Testing/Procedures: None Ordered   Follow-Up: At [facility], you and your health needs are our priority.  As part of our continuing mission to provide you with exceptional heart care, we have created designated Provider Care Teams.  These Care Teams include your primary Cardiologist (physician) and Advanced Practice Providers (APPs -  Physician Assistants and Nurse Practitioners) who all work together to provide you with the care you need, when you need it.  We recommend signing up for the patient portal called MyChart.  Sign up information is provided on this After Visit Summary.  MyChart is used to connect with patients for Virtual Visits (Telemedicine).  Patients are able to view lab/test results, encounter notes, upcoming appointments, etc.  Non-urgent messages can be sent to your provider as well.   To learn more about what you can do with MyChart, go to [email or web address].    Your next appointment:   12 month(s)  The format for your next appointment:   In Person  Provider:   Lamar Fitch, MD    Other Instructions NA

## 2024-03-27 ENCOUNTER — Other Ambulatory Visit: Payer: Self-pay

## 2024-03-27 ENCOUNTER — Encounter (HOSPITAL_BASED_OUTPATIENT_CLINIC_OR_DEPARTMENT_OTHER): Payer: Self-pay | Admitting: Emergency Medicine

## 2024-03-27 DIAGNOSIS — R0989 Other specified symptoms and signs involving the circulatory and respiratory systems: Secondary | ICD-10-CM | POA: Diagnosis not present

## 2024-03-27 DIAGNOSIS — Z7984 Long term (current) use of oral hypoglycemic drugs: Secondary | ICD-10-CM | POA: Diagnosis not present

## 2024-03-27 DIAGNOSIS — I10 Essential (primary) hypertension: Secondary | ICD-10-CM | POA: Diagnosis not present

## 2024-03-27 DIAGNOSIS — Z7982 Long term (current) use of aspirin: Secondary | ICD-10-CM | POA: Diagnosis not present

## 2024-03-27 DIAGNOSIS — Z79899 Other long term (current) drug therapy: Secondary | ICD-10-CM | POA: Diagnosis not present

## 2024-03-27 DIAGNOSIS — J029 Acute pharyngitis, unspecified: Secondary | ICD-10-CM | POA: Diagnosis present

## 2024-03-27 DIAGNOSIS — E119 Type 2 diabetes mellitus without complications: Secondary | ICD-10-CM | POA: Diagnosis not present

## 2024-03-27 DIAGNOSIS — R051 Acute cough: Secondary | ICD-10-CM | POA: Insufficient documentation

## 2024-03-27 LAB — GROUP A STREP BY PCR: Group A Strep by PCR: NOT DETECTED

## 2024-03-27 NOTE — ED Triage Notes (Signed)
 Pt c/o feeling like choking on L and R side of neck, also c/o feeling like he needs to burp, x 3 days.  Denies cough, fever. I have to make an effort to swallow.  Denies neck or facial swelling.   Denies hx of food bolus.   Took tylenol appx 2 hr pta.

## 2024-03-28 ENCOUNTER — Emergency Department (HOSPITAL_BASED_OUTPATIENT_CLINIC_OR_DEPARTMENT_OTHER)
Admission: EM | Admit: 2024-03-28 | Discharge: 2024-03-28 | Disposition: A | Discharge status: Home / Self Care | Attending: Emergency Medicine | Admitting: Emergency Medicine

## 2024-03-28 ENCOUNTER — Emergency Department (HOSPITAL_BASED_OUTPATIENT_CLINIC_OR_DEPARTMENT_OTHER)

## 2024-03-28 DIAGNOSIS — R0989 Other specified symptoms and signs involving the circulatory and respiratory systems: Secondary | ICD-10-CM

## 2024-03-28 DIAGNOSIS — R051 Acute cough: Secondary | ICD-10-CM

## 2024-03-28 LAB — CBC WITH DIFFERENTIAL/PLATELET
Abs Immature Granulocytes: 0.02 K/uL (ref 0.00–0.07)
Basophils Absolute: 0 K/uL (ref 0.0–0.1)
Basophils Relative: 1 %
Eosinophils Absolute: 0.3 K/uL (ref 0.0–0.5)
Eosinophils Relative: 4 %
HCT: 47.9 % (ref 39.0–52.0)
Hemoglobin: 15.7 g/dL (ref 13.0–17.0)
Immature Granulocytes: 0 %
Lymphocytes Relative: 23 %
Lymphs Abs: 1.4 K/uL (ref 0.7–4.0)
MCH: 27.8 pg (ref 26.0–34.0)
MCHC: 32.8 g/dL (ref 30.0–36.0)
MCV: 84.9 fL (ref 80.0–100.0)
Monocytes Absolute: 0.9 K/uL (ref 0.1–1.0)
Monocytes Relative: 14 %
Neutro Abs: 3.6 K/uL (ref 1.7–7.7)
Neutrophils Relative %: 58 %
Platelets: 230 K/uL (ref 150–400)
RBC: 5.64 MIL/uL (ref 4.22–5.81)
RDW: 13.2 % (ref 11.5–15.5)
WBC: 6.2 K/uL (ref 4.0–10.5)
nRBC: 0 % (ref 0.0–0.2)

## 2024-03-28 LAB — BASIC METABOLIC PANEL WITH GFR
Anion gap: 12 (ref 5–15)
BUN: 13 mg/dL (ref 6–20)
CO2: 26 mmol/L (ref 22–32)
Calcium: 9.5 mg/dL (ref 8.9–10.3)
Chloride: 101 mmol/L (ref 98–111)
Creatinine, Ser: 0.94 mg/dL (ref 0.61–1.24)
GFR, Estimated: >60 mL/min
Glucose, Bld: 97 mg/dL (ref 70–99)
Potassium: 3.8 mmol/L (ref 3.5–5.1)
Sodium: 139 mmol/L (ref 135–145)

## 2024-03-28 LAB — TROPONIN T, HIGH SENSITIVITY: Troponin T High Sensitivity: <15 ng/L (ref 0–19)

## 2024-03-28 MED ORDER — ALUM & MAG HYDROXIDE-SIMETH 200-200-20 MG/5ML PO SUSP
30.0000 mL | Freq: Once | ORAL | Status: AC
  Administered 2024-03-28: 30 mL via ORAL
  Filled 2024-03-28: qty 30

## 2024-03-28 MED ORDER — PANTOPRAZOLE SODIUM 40 MG PO TBEC
40.0000 mg | DELAYED_RELEASE_TABLET | Freq: Once | ORAL | Status: AC
  Administered 2024-03-28: 40 mg via ORAL
  Filled 2024-03-28: qty 1

## 2024-03-28 MED ORDER — PANTOPRAZOLE SODIUM 40 MG PO TBEC: 40.0000 mg | DELAYED_RELEASE_TABLET | Freq: Every day | ORAL | 0 refills | Status: AC

## 2024-03-28 MED ORDER — BENZONATATE 100 MG PO CAPS
200.0000 mg | ORAL_CAPSULE | Freq: Once | ORAL | Status: AC
  Administered 2024-03-28: 200 mg via ORAL
  Filled 2024-03-28: qty 2

## 2024-03-28 MED ORDER — BENZONATATE 100 MG PO CAPS: 100.0000 mg | ORAL_CAPSULE | Freq: Three times a day (TID) | ORAL | 0 refills | Status: AC

## 2024-03-28 NOTE — ED Provider Notes (Signed)
 " [place] [facility] Provider Note   CSN: [id number] Arrival date & time: 03/27/24  2211     Patient presents with: Sore Throat   Gregory Hancock is a 49 y.o. male.   The history is provided by the patient.  Sore Throat   He has history of hypertension, diabetes, hyperlipidemia and comes in with a sensation like he has to belch but is unable to do so.  This has been gradually worsening over a period of about 5 days.  He is able to swallow normally.  He has taken Gas-X and bisacodyl without relief.  He denies dyspnea, nausea, vomiting, diaphoresis.  He is a non-smoker.    Prior to Admission medications  Medication Sig Start Date End Date Taking? Authorizing Provider  aspirin  EC 81 MG tablet Take 81 mg by mouth daily. Swallow whole.    [provider]  atorvastatin  (LIPITOR) 80 MG tablet Take 1 tablet (80 mg total) by mouth daily. 02/28/24 05/28/24  Krasowski, Robert J, MD  clindamycin (CLEOCIN T) 1 % lotion Apply 1 Application topically daily. 02/27/24   [provider]  dapagliflozin propanediol (FARXIGA) 5 MG TABS tablet Take 5 mg by mouth daily.    [provider]  gabapentin (NEURONTIN) 300 MG capsule Take 300 mg by mouth at bedtime. 02/27/24 08/25/24  [provider]  levothyroxine (SYNTHROID) 50 MCG tablet Take 50 mcg by mouth daily. 08/08/23   [provider]  metFORMIN  (GLUCOPHAGE -XR) 500 MG 24 hr tablet TAKE ONE TABLET BY MOUTH TWICE DAILY 12/06/22   Amin, Saad, MD  metoprolol  succinate (TOPROL -XL) 25 MG 24 hr tablet Take 1 tablet (25 mg total) by mouth 2 (two) times daily. 02/02/24   Krasowski, Robert J, MD  montelukast  (SINGULAIR ) 10 MG tablet TAKE ONE TABLET BY MOUTH ONCE A DAY 02/03/23   Caleen Dirks, MD  omega-3 acid ethyl esters (LOVAZA) 1 g capsule Take 1 g by mouth daily. 10/19/23   [provider]  omeprazole  (PRILOSEC) 40 MG capsule Take 1 capsule (40 mg total) by mouth daily. 03/03/22    Amin, Saad, MD  OZEMPIC, 0.25 OR 0.5 MG/DOSE, 2 MG/3ML SOPN Inject 0.25 mg into the skin every 7 (seven) days. 02/15/24   [provider]  potassium chloride  SA (KLOR-CON  M) 20 MEQ tablet Take 1 tablet (20 mEq total) by mouth daily. 03/16/22   Raford Lenis, MD  valsartan (DIOVAN) 160 MG tablet Take 160 mg by mouth daily. 05/31/22   [provider]    Allergies: Advil [ibuprofen] and Semaglutide    Review of Systems  All other systems reviewed and are negative.   Updated Vital Signs BP (!) 138/95 (BP Location: Right Arm)   Pulse 100   Temp 99 F (37.2 C)   Resp 16   Ht 5' 11 (1.803 m)   Wt 106.6 kg   SpO2 99%   BMI 32.78 kg/m   Physical Exam Vitals and nursing note reviewed.   49 year old male, resting comfortably and in no acute distress. Vital signs are significant for mildly elevated blood pressure. Oxygen saturation is 99%, which is normal. Head is normocephalic and atraumatic. PERRLA, EOMI. Oropharynx is clear. Neck is nontender and supple without adenopathy. Lungs are clear without rales, wheezes, or rhonchi. Chest is nontender. Heart has regular rate and rhythm without murmur. Abdomen is soft, flat, nontender. Neurologic: Mental status is normal, cranial nerves are intact, moves all extremities equally.  (all labs ordered are listed,  but only abnormal results are displayed) Labs Reviewed  GROUP A STREP BY PCR  BASIC METABOLIC PANEL WITH GFR  CBC WITH DIFFERENTIAL/PLATELET  TROPONIN T, HIGH SENSITIVITY    EKG: EKG Interpretation Date/Time:  Wednesday March 28 2024 02:22:13 EST Ventricular Rate:  99 PR Interval:  142 QRS Duration:  86 QT Interval:  331 QTC Calculation: 425 R Axis:   66  Text Interpretation: Sinus rhythm ST elevation in lateral leads T wave inversion Inferior leads When compared with ECG of 09/01/2023, T wave inversion is new Confirmed by Raford Lenis ([id number]) on 03/28/2024 2:30:17 AM  Radiology: DG Chest 2 View Result  Date: 03/28/2024 EXAM: 2 VIEW(S) XRAY OF THE CHEST 03/28/2024 02:32:00 AM COMPARISON: 05/15/2022 CLINICAL HISTORY: chest discomfort FINDINGS: LUNGS AND PLEURA: No focal pulmonary opacity. No pleural effusion. No pneumothorax. HEART AND MEDIASTINUM: No acute abnormality of the cardiac and mediastinal silhouettes. BONES AND SOFT TISSUES: No acute osseous abnormality. IMPRESSION: 1. No active cardiopulmonary disease. Electronically signed by: Franky Crease MD 03/28/2024 02:41 AM EST RP Workstation: [id number]     Procedures   Medications Ordered in the ED  alum & mag hydroxide-simeth (MAALOX/MYLANTA) 200-200-20 MG/5ML suspension 30 mL (30 mLs Oral Given 03/28/24 0204)  pantoprazole  (PROTONIX ) EC tablet 40 mg (40 mg Oral Given 03/28/24 0204)  benzonatate  (TESSALON ) capsule 200 mg (200 mg Oral Given 03/28/24 0234)                                    Medical Decision Making Amount and/or Complexity of Data Reviewed Labs: ordered. Radiology: ordered.  Risk OTC drugs. Prescription drug management.   Report of sensation of needing to burp and unable to do so.  Cause is not clear.  I suspect some period of GERD, but I am ordering laboratory workup and EKG and chest x-ray to rule out cardiac etiology.  I have also ordered a dose of an antiacid and pantoprazole .  He complained of a dry cough, I ordered a dose of benzonatate .  I have reviewed his laboratory test, my interpretation is normal CBC, normal basic metabolic panel, normal troponin.  Chest x-ray shows no acute cardiopulmonary process.  Have independently viewed the images, and agree with radiologist's interpretation.  I have reviewed his electrocardiogram, and my interpretation is ST elevation in the lateral leads which had been present previously, new T wave inversions in the inferior leads likely related to left ventricular hypertrophy.  Patient states that symptoms have not improved with pantoprazole , but I do feel he should continue on that.  I am  discharging him with a prescription for pantoprazole  and a prescription for benzonatate  and I am referring him to gastroenterology for follow-up.  Return precautions discussed.     Final diagnoses:  Choking sensation  Acute cough    ED Discharge Orders          Ordered    benzonatate  (TESSALON ) 100 MG capsule  Every 8 hours        03/28/24 0322    pantoprazole  (PROTONIX ) 40 MG tablet  Daily        03/28/24 0322               Raford Lenis, MD 03/28/24 [phone]  "

## 2024-03-28 NOTE — Discharge Instructions (Signed)
 Your evaluation today did not show any serious problems.  I suspect that your sensation is related to acid reflux and I have ordered a prescription for a proton pump inhibitor-pantoprazole .  Please take that once a day.  I recommended trying to sleep propped up on several pillows to minimize the amount of acid washing back into your esophagus.  You are welcome to return at any time if you are having any concerning symptoms at home.

## 2024-04-18 LAB — LIPID PANEL
Chol/HDL Ratio: 4 ratio (ref 0.0–5.0)
Cholesterol, Total: 121 mg/dL (ref 100–199)
HDL: 30 mg/dL — ABNORMAL LOW
LDL Chol Calc (NIH): 51 mg/dL (ref 0–99)
Triglycerides: 256 mg/dL — ABNORMAL HIGH (ref 0–149)
VLDL Cholesterol Cal: 40 mg/dL (ref 5–40)

## 2024-04-18 LAB — AST: AST: 21 [IU]/L (ref 0–40)

## 2024-04-18 LAB — ALT: ALT: 29 [IU]/L (ref 0–44)

## 2024-04-23 ENCOUNTER — Ambulatory Visit: Payer: Self-pay | Admitting: Cardiology
